# Patient Record
Sex: Female | Born: 1985 | Hispanic: No | Marital: Married | Smoking: Never smoker
Health system: Southern US, Community
[De-identification: ages and names within clinical notes are randomized; demographics above are authoritative.]

## PROBLEM LIST (undated history)

## (undated) ENCOUNTER — Inpatient Hospital Stay (HOSPITAL_COMMUNITY): Payer: Self-pay

## (undated) DIAGNOSIS — E611 Iron deficiency: Secondary | ICD-10-CM

## (undated) DIAGNOSIS — I10 Essential (primary) hypertension: Secondary | ICD-10-CM

## (undated) DIAGNOSIS — K219 Gastro-esophageal reflux disease without esophagitis: Secondary | ICD-10-CM

## (undated) DIAGNOSIS — Z789 Other specified health status: Secondary | ICD-10-CM

## (undated) HISTORY — DX: Other specified health status: Z78.9

## (undated) HISTORY — DX: Gastro-esophageal reflux disease without esophagitis: K21.9

## (undated) HISTORY — PX: NO PAST SURGERIES: SHX2092

---

## 2016-01-01 NOTE — L&D Delivery Note (Signed)
30 y/o G1P0 who presented with spontaneous onset of labor. No complications of pregnancy  Delivery Note At  a viable female was delivered via  (Presentation:direct OA ;  ).  APGAR:9 , 9; Placenta status: delivered intact with gentle traction Cord: 3 vessel  Anesthesia:  Local lidocaine for repair Episiotomy:   none Lacerations:   partial 3rd degree not involving the rectal mucosa Suture Repair: 3.0 vircyl Est. Blood Loss (mL):   100  Mom to postpartum.  Baby to Couplet care / Skin to Skin.  Ernestina Pennaicholas Byanka Landrus 08/31/2016, 11:42 AM

## 2016-03-19 LAB — OB RESULTS CONSOLE ABO/RH: RH Type: POSITIVE

## 2016-03-19 LAB — OB RESULTS CONSOLE GC/CHLAMYDIA
CHLAMYDIA, DNA PROBE: NEGATIVE
Gonorrhea: NEGATIVE

## 2016-03-19 LAB — OB RESULTS CONSOLE HIV ANTIBODY (ROUTINE TESTING): HIV: NONREACTIVE

## 2016-03-19 LAB — OB RESULTS CONSOLE ANTIBODY SCREEN: ANTIBODY SCREEN: NEGATIVE

## 2016-03-19 LAB — OB RESULTS CONSOLE HEPATITIS B SURFACE ANTIGEN: HEP B S AG: NEGATIVE

## 2016-03-19 LAB — OB RESULTS CONSOLE RPR: RPR: NONREACTIVE

## 2016-03-19 LAB — OB RESULTS CONSOLE RUBELLA ANTIBODY, IGM: RUBELLA: IMMUNE

## 2016-04-23 ENCOUNTER — Inpatient Hospital Stay (HOSPITAL_COMMUNITY)
Admission: AD | Admit: 2016-04-23 | Discharge: 2016-04-23 | Disposition: A | Discharge status: Home / Self Care | Payer: Medicaid Other | Source: Ambulatory Visit | Attending: Family Medicine | Admitting: Family Medicine

## 2016-04-23 ENCOUNTER — Encounter (HOSPITAL_COMMUNITY): Payer: Self-pay | Admitting: *Deleted

## 2016-04-23 DIAGNOSIS — O26899 Other specified pregnancy related conditions, unspecified trimester: Secondary | ICD-10-CM

## 2016-04-23 DIAGNOSIS — Z3A20 20 weeks gestation of pregnancy: Secondary | ICD-10-CM | POA: Diagnosis not present

## 2016-04-23 DIAGNOSIS — R109 Unspecified abdominal pain: Secondary | ICD-10-CM | POA: Diagnosis not present

## 2016-04-23 DIAGNOSIS — O9989 Other specified diseases and conditions complicating pregnancy, childbirth and the puerperium: Secondary | ICD-10-CM

## 2016-04-23 DIAGNOSIS — O26892 Other specified pregnancy related conditions, second trimester: Secondary | ICD-10-CM | POA: Diagnosis present

## 2016-04-23 LAB — URINALYSIS, ROUTINE W REFLEX MICROSCOPIC
Bilirubin Urine: NEGATIVE
Glucose, UA: NEGATIVE mg/dL
Hgb urine dipstick: NEGATIVE
Ketones, ur: NEGATIVE mg/dL
Leukocytes, UA: NEGATIVE
NITRITE: NEGATIVE
Protein, ur: NEGATIVE mg/dL
SPECIFIC GRAVITY, URINE: 1.01 (ref 1.005–1.030)
pH: 6 (ref 5.0–8.0)

## 2016-04-23 NOTE — MAU Provider Note (Signed)
History   G1 @ 20 wks in with abd cramping that started today. Denies vag bleeding or ROM.  CSN: [id number]  Arrival date & time 04/23/16  1629   None     Chief Complaint  Patient presents with  . Abdominal Pain    HPI  No past medical history on file.  No past surgical history on file.  No family history on file.  Social History  Substance Use Topics  . Smoking status: None  . Smokeless tobacco: None  . Alcohol Use: None    OB History    Gravida Para Term Preterm AB TAB SAB Ectopic Multiple Living   1 0 0 0 0 0 0 0 0 0       Review of Systems  Constitutional: Negative.   HENT: Negative.   Eyes: Negative.   Respiratory: Negative.   Cardiovascular: Negative.   Gastrointestinal: Positive for abdominal pain.  Endocrine: Negative.   Genitourinary: Negative.   Musculoskeletal: Negative.   Skin: Negative.   Allergic/Immunologic: Negative.   Neurological: Negative.   Hematological: Negative.   Psychiatric/Behavioral: Negative.     Allergies  Review of patient's allergies indicates not on file.  Home Medications  No current outpatient prescriptions on file.  BP 115/65 mmHg  Pulse 102  Temp(Src) 98.2 F (36.8 C) (Oral)  Resp 18  Ht 5' 8.5" (1.74 m)  Wt 123 lb 12.8 oz (56.155 kg)  BMI 18.55 kg/m2  SpO2 100%  LMP 12/03/2015  Physical Exam  Constitutional: She is oriented to person, place, and time. She appears well-developed and well-nourished.  HENT:  Head: Normocephalic.  Eyes: Pupils are equal, round, and reactive to light.  Neck: Normal range of motion.  Cardiovascular: Normal rate, regular rhythm, normal heart sounds and intact distal pulses.   Pulmonary/Chest: Effort normal and breath sounds normal.  Abdominal: Soft. Bowel sounds are normal.  Genitourinary: Vagina normal and uterus normal.  Musculoskeletal: Normal range of motion.  Neurological: She is alert and oriented to person, place, and time. She has normal reflexes.  Skin: Skin is warm  and dry.  Psychiatric: She has a normal mood and affect. Her behavior is normal. Judgment and thought content normal.    MAU Course  Procedures (including critical care time)  Labs Reviewed  URINALYSIS, ROUTINE W REFLEX MICROSCOPIC (NOT AT [facility])   No results found.   No diagnosis found.    MDM  SVE firm/cl/post/high. Will d/c home . FHR [address] and reg.

## 2016-04-23 NOTE — MAU Note (Signed)
Patient c/o abdominal pain for past few days.  [redacted] weeks pregnant, denies vaginal bleeding or LOF.

## 2016-07-11 ENCOUNTER — Inpatient Hospital Stay (HOSPITAL_COMMUNITY)
Admission: AD | Admit: 2016-07-11 | Discharge: 2016-07-11 | Disposition: A | Discharge status: Home / Self Care | Payer: Medicaid Other | Source: Ambulatory Visit | Attending: Obstetrics & Gynecology | Admitting: Obstetrics & Gynecology

## 2016-07-11 DIAGNOSIS — R519 Headache, unspecified: Secondary | ICD-10-CM

## 2016-07-11 DIAGNOSIS — O26893 Other specified pregnancy related conditions, third trimester: Secondary | ICD-10-CM | POA: Insufficient documentation

## 2016-07-11 DIAGNOSIS — R5383 Other fatigue: Secondary | ICD-10-CM

## 2016-07-11 DIAGNOSIS — R5381 Other malaise: Secondary | ICD-10-CM

## 2016-07-11 DIAGNOSIS — R51 Headache: Secondary | ICD-10-CM | POA: Diagnosis present

## 2016-07-11 DIAGNOSIS — Z3A31 31 weeks gestation of pregnancy: Secondary | ICD-10-CM | POA: Diagnosis not present

## 2016-07-11 LAB — URINALYSIS, ROUTINE W REFLEX MICROSCOPIC
Bilirubin Urine: NEGATIVE
GLUCOSE, UA: NEGATIVE mg/dL
HGB URINE DIPSTICK: NEGATIVE
Ketones, ur: NEGATIVE mg/dL
LEUKOCYTES UA: NEGATIVE
Nitrite: NEGATIVE
PROTEIN: NEGATIVE mg/dL
pH: 6 (ref 5.0–8.0)

## 2016-07-11 LAB — CBC
HEMATOCRIT: 36.5 % (ref 36.0–46.0)
HEMOGLOBIN: 12 g/dL (ref 12.0–15.0)
MCH: 25.4 pg — ABNORMAL LOW (ref 26.0–34.0)
MCHC: 32.9 g/dL (ref 30.0–36.0)
MCV: 77.3 fL — AB (ref 78.0–100.0)
Platelets: 240 10*3/uL (ref 150–400)
RBC: 4.72 MIL/uL (ref 3.87–5.11)
RDW: 15.3 % (ref 11.5–15.5)
WBC: 12.4 10*3/uL — AB (ref 4.0–10.5)

## 2016-07-11 NOTE — MAU Provider Note (Signed)
MAU HISTORY AND PHYSICAL  Chief Complaint:  Headache   Allison Holmes is a 30 y.o.  G2P0000 with IUP at 3950w4d presenting for Headache . Patient states she has been having  none contractions, none vaginal bleeding, intact membranes, with active fetal movement.    No past medical history on file.  No past surgical history on file.  No family history on file.  Social History  Substance Use Topics  . Smoking status: Not on file  . Smokeless tobacco: Not on file  . Alcohol Use: Not on file    No Known Allergies  Prescriptions prior to admission  Medication Sig Dispense Refill Last Dose  . acetaminophen (TYLENOL) 500 MG tablet Take 500 mg by mouth every 6 (six) hours as needed for moderate pain or headache.   07/11/2016 at Unknown time  . Prenatal Vit-Fe Fumarate-FA (PRENATAL MULTIVITAMIN) TABS tablet Take 1 tablet by mouth daily at 12 noon.   07/10/2016 at Unknown time    Review of Systems - Negative except for what is mentioned in HPI.  Physical Exam  Blood pressure 99/59, pulse 80, temperature 98.3 F (36.8 C), temperature source Oral, resp. rate 18, last menstrual period 12/03/2015. GENERAL: Well-developed, well-nourished female in no acute distress.  LUNGS: Clear to auscultation bilaterally.  HEART: Regular rate and rhythm. ABDOMEN: Soft, nontender, nondistended, gravid.  EXTREMITIES: Nontender, no edema, 2+ distal pulses. FHT:  Cat 1 Contractions: very infrequent   Labs: Results for orders placed or performed during the hospital encounter of 07/11/16 (from the past 24 hour(s))  Urinalysis, Routine w reflex microscopic (not at [facility])   Collection Time: 07/11/16  4:45 PM  Result Value Ref Range   Color, Urine YELLOW YELLOW   APPearance CLEAR CLEAR   Specific Gravity, Urine <1.005 (L) 1.005 - 1.030   pH 6.0 5.0 - 8.0   Glucose, UA NEGATIVE NEGATIVE mg/dL   Hgb urine dipstick NEGATIVE NEGATIVE   Bilirubin Urine NEGATIVE NEGATIVE   Ketones, ur NEGATIVE NEGATIVE mg/dL   Protein, ur NEGATIVE NEGATIVE mg/dL   Nitrite NEGATIVE NEGATIVE   Leukocytes, UA NEGATIVE NEGATIVE    Imaging Studies:  No results found.  Assessment: Allison Holmes is  30 y.o. G2P0000 at 4750w4d presents with Headache .  Plan: Headache, mild shortness of breath: Lung exam clear, HA is relieved by tylenol. BP WNL. Pt appears undernourished, will get CBC to rule out anemia.  -if CBC within normal limits, will dispo home to f/u with [facility]  Loni MuseKate Timberlake 7/12/20178:11 PM  I have participated in the care of this patient and I agree with the above. Cam HaiSHAW, Odelia Graciano CNM 10:31 PM 07/13/2016

## 2016-07-11 NOTE — MAU Note (Signed)
Pt received to MAU c/o feeling feverish with an overall complaint of fatigue. Interpreter contacted via [employer]. EFM applied. [id number]. Abdomen soft on palpation. Carmelina DaneERRI L Sebert Stollings, RN

## 2016-07-11 NOTE — MAU Note (Addendum)
Pt C/O fever last night, did not take temp, HA, unable to sleep very much.  Denies abd pain, bleeding or LOF.  Had vomiting yesterday, none today.

## 2016-08-13 LAB — OB RESULTS CONSOLE GBS: GBS: POSITIVE

## 2016-08-30 ENCOUNTER — Inpatient Hospital Stay (HOSPITAL_COMMUNITY)
Admission: AD | Admit: 2016-08-30 | Discharge: 2016-08-30 | Disposition: A | Discharge status: Home / Self Care | Payer: Medicaid Other | Source: Ambulatory Visit | Attending: Obstetrics and Gynecology | Admitting: Obstetrics and Gynecology

## 2016-08-30 ENCOUNTER — Encounter (HOSPITAL_COMMUNITY): Payer: Self-pay | Admitting: *Deleted

## 2016-08-30 NOTE — MAU Note (Signed)
Pt started having contraction last night about 2300 and having progressed to every 11 minutes now.  Pt report some bloody show, denies ROM.  Good fetal movement.

## 2016-08-30 NOTE — Discharge Instructions (Signed)
Fetal Movement Counts  Patient Name: __________________________________________________ Patient Due Date: ____________________  Performing a fetal movement count is highly recommended in high-risk pregnancies, but it is good for every pregnant woman to do. Your health care provider may ask you to start counting fetal movements at 28 weeks of the pregnancy. Fetal movements often increase:  · After eating a full meal.  · After physical activity.  · After eating or drinking something sweet or cold.  · At rest.  Pay attention to when you feel the baby is most active. This will help you notice a pattern of your baby's sleep and wake cycles and what factors contribute to an increase in fetal movement. It is important to perform a fetal movement count at the same time each day when your baby is normally most active.   HOW TO COUNT FETAL MOVEMENTS  1. Find a quiet and comfortable area to sit or lie down on your left side. Lying on your left side provides the best blood and oxygen circulation to your baby.  2. Write down the day and time on a sheet of paper or in a journal.  3. Start counting kicks, flutters, swishes, rolls, or jabs in a 2-hour period. You should feel at least 10 movements within 2 hours.  4. If you do not feel 10 movements in 2 hours, wait 2-3 hours and count again. Look for a change in the pattern or not enough counts in 2 hours.  SEEK MEDICAL CARE IF:  · You feel less than 10 counts in 2 hours, tried twice.  · There is no movement in over an hour.  · The pattern is changing or taking longer each day to reach 10 counts in 2 hours.  · You feel the baby is not moving as he or she usually does.  Date: ____________ Movements: ____________ Start time: ____________ Finish time: ____________   Date: ____________ Movements: ____________ Start time: ____________ Finish time: ____________  Date: ____________ Movements: ____________ Start time: ____________ Finish time: ____________  Date: ____________ Movements:  ____________ Start time: ____________ Finish time: ____________  Date: ____________ Movements: ____________ Start time: ____________ Finish time: ____________  Date: ____________ Movements: ____________ Start time: ____________ Finish time: ____________  Date: ____________ Movements: ____________ Start time: ____________ Finish time: ____________  Date: ____________ Movements: ____________ Start time: ____________ Finish time: ____________   Date: ____________ Movements: ____________ Start time: ____________ Finish time: ____________  Date: ____________ Movements: ____________ Start time: ____________ Finish time: ____________  Date: ____________ Movements: ____________ Start time: ____________ Finish time: ____________  Date: ____________ Movements: ____________ Start time: ____________ Finish time: ____________  Date: ____________ Movements: ____________ Start time: ____________ Finish time: ____________  Date: ____________ Movements: ____________ Start time: ____________ Finish time: ____________  Date: ____________ Movements: ____________ Start time: ____________ Finish time: ____________   Date: ____________ Movements: ____________ Start time: ____________ Finish time: ____________  Date: ____________ Movements: ____________ Start time: ____________ Finish time: ____________  Date: ____________ Movements: ____________ Start time: ____________ Finish time: ____________  Date: ____________ Movements: ____________ Start time: ____________ Finish time: ____________  Date: ____________ Movements: ____________ Start time: ____________ Finish time: ____________  Date: ____________ Movements: ____________ Start time: ____________ Finish time: ____________  Date: ____________ Movements: ____________ Start time: ____________ Finish time: ____________   Date: ____________ Movements: ____________ Start time: ____________ Finish time: ____________  Date: ____________ Movements: ____________ Start time: ____________ Finish  time: ____________  Date: ____________ Movements: ____________ Start time: ____________ Finish time: ____________  Date: ____________ Movements: ____________ Start time:   ____________ Finish time: ____________  Date: ____________ Movements: ____________ Start time: ____________ Finish time: ____________  Date: ____________ Movements: ____________ Start time: ____________ Finish time: ____________  Date: ____________ Movements: ____________ Start time: ____________ Finish time: ____________   Date: ____________ Movements: ____________ Start time: ____________ Finish time: ____________  Date: ____________ Movements: ____________ Start time: ____________ Finish time: ____________  Date: ____________ Movements: ____________ Start time: ____________ Finish time: ____________  Date: ____________ Movements: ____________ Start time: ____________ Finish time: ____________  Date: ____________ Movements: ____________ Start time: ____________ Finish time: ____________  Date: ____________ Movements: ____________ Start time: ____________ Finish time: ____________  Date: ____________ Movements: ____________ Start time: ____________ Finish time: ____________   Date: ____________ Movements: ____________ Start time: ____________ Finish time: ____________  Date: ____________ Movements: ____________ Start time: ____________ Finish time: ____________  Date: ____________ Movements: ____________ Start time: ____________ Finish time: ____________  Date: ____________ Movements: ____________ Start time: ____________ Finish time: ____________  Date: ____________ Movements: ____________ Start time: ____________ Finish time: ____________  Date: ____________ Movements: ____________ Start time: ____________ Finish time: ____________  Date: ____________ Movements: ____________ Start time: ____________ Finish time: ____________   Date: ____________ Movements: ____________ Start time: ____________ Finish time: ____________  Date: ____________  Movements: ____________ Start time: ____________ Finish time: ____________  Date: ____________ Movements: ____________ Start time: ____________ Finish time: ____________  Date: ____________ Movements: ____________ Start time: ____________ Finish time: ____________  Date: ____________ Movements: ____________ Start time: ____________ Finish time: ____________  Date: ____________ Movements: ____________ Start time: ____________ Finish time: ____________  Date: ____________ Movements: ____________ Start time: ____________ Finish time: ____________   Date: ____________ Movements: ____________ Start time: ____________ Finish time: ____________  Date: ____________ Movements: ____________ Start time: ____________ Finish time: ____________  Date: ____________ Movements: ____________ Start time: ____________ Finish time: ____________  Date: ____________ Movements: ____________ Start time: ____________ Finish time: ____________  Date: ____________ Movements: ____________ Start time: ____________ Finish time: ____________  Date: ____________ Movements: ____________ Start time: ____________ Finish time: ____________     This information is not intended to replace advice given to you by your health care provider. Make sure you discuss any questions you have with your health care provider.     Document Released: 01/16/2007 Document Revised: 01/07/2015 Document Reviewed: 10/13/2012  Elsevier Interactive Patient Education ©2016 [employer]

## 2016-08-31 ENCOUNTER — Encounter (HOSPITAL_COMMUNITY): Payer: Self-pay | Admitting: *Deleted

## 2016-08-31 ENCOUNTER — Inpatient Hospital Stay (HOSPITAL_COMMUNITY)
Admission: AD | Admit: 2016-08-31 | Discharge: 2016-09-02 | DRG: 775 | Disposition: A | Discharge status: Home / Self Care | Payer: Medicaid Other | Source: Ambulatory Visit | Attending: Obstetrics and Gynecology | Admitting: Obstetrics and Gynecology

## 2016-08-31 DIAGNOSIS — O99824 Streptococcus B carrier state complicating childbirth: Secondary | ICD-10-CM | POA: Diagnosis present

## 2016-08-31 DIAGNOSIS — Z3A38 38 weeks gestation of pregnancy: Secondary | ICD-10-CM | POA: Diagnosis not present

## 2016-08-31 DIAGNOSIS — Z3493 Encounter for supervision of normal pregnancy, unspecified, third trimester: Secondary | ICD-10-CM

## 2016-08-31 DIAGNOSIS — Z3403 Encounter for supervision of normal first pregnancy, third trimester: Secondary | ICD-10-CM | POA: Diagnosis present

## 2016-08-31 LAB — CBC
HCT: 36.7 % (ref 36.0–46.0)
Hemoglobin: 12.4 g/dL (ref 12.0–15.0)
MCH: 25.3 pg — ABNORMAL LOW (ref 26.0–34.0)
MCHC: 33.8 g/dL (ref 30.0–36.0)
MCV: 74.9 fL — AB (ref 78.0–100.0)
PLATELETS: 216 10*3/uL (ref 150–400)
RBC: 4.9 MIL/uL (ref 3.87–5.11)
RDW: 16.1 % — AB (ref 11.5–15.5)
WBC: 11.8 10*3/uL — ABNORMAL HIGH (ref 4.0–10.5)

## 2016-08-31 LAB — ABO/RH: ABO/RH(D): B POS

## 2016-08-31 LAB — TYPE AND SCREEN
ABO/RH(D): B POS
Antibody Screen: NEGATIVE

## 2016-08-31 MED ORDER — DIPHENHYDRAMINE HCL 25 MG PO CAPS: 25.0000 mg | ORAL_CAPSULE | Freq: Four times a day (QID) | ORAL | Status: DC | PRN

## 2016-08-31 MED ORDER — PENICILLIN G POTASSIUM 5000000 UNITS IJ SOLR
2.5000 10*6.[IU] | INTRAVENOUS | Status: DC
  Administered 2016-08-31: 2.5 10*6.[IU] via INTRAVENOUS
  Filled 2016-08-31 (×5): qty 2.5

## 2016-08-31 MED ORDER — PENICILLIN G POTASSIUM 5000000 UNITS IJ SOLR
5.0000 10*6.[IU] | Freq: Once | INTRAVENOUS | Status: AC
  Administered 2016-08-31: 5 10*6.[IU] via INTRAVENOUS
  Filled 2016-08-31: qty 5

## 2016-08-31 MED ORDER — EPHEDRINE 5 MG/ML INJ
10.0000 mg | INTRAVENOUS | Status: DC | PRN
Start: 2016-08-31 — End: 2016-08-31
  Filled 2016-08-31: qty 4

## 2016-08-31 MED ORDER — ACETAMINOPHEN 325 MG PO TABS: 650.0000 mg | ORAL_TABLET | ORAL | Status: DC | PRN

## 2016-08-31 MED ORDER — SIMETHICONE 80 MG PO CHEW: 80.0000 mg | CHEWABLE_TABLET | ORAL | Status: DC | PRN

## 2016-08-31 MED ORDER — BENZOCAINE-MENTHOL 20-0.5 % EX AERO
1.0000 "application " | INHALATION_SPRAY | Freq: Four times a day (QID) | CUTANEOUS | Status: DC | PRN
  Filled 2016-08-31 (×3): qty 56

## 2016-08-31 MED ORDER — ONDANSETRON HCL 4 MG/2ML IJ SOLN: 4.0000 mg | INTRAMUSCULAR | Status: DC | PRN

## 2016-08-31 MED ORDER — ONDANSETRON HCL 4 MG PO TABS: 4.0000 mg | ORAL_TABLET | ORAL | Status: DC | PRN

## 2016-08-31 MED ORDER — SENNOSIDES-DOCUSATE SODIUM 8.6-50 MG PO TABS
2.0000 | ORAL_TABLET | ORAL | Status: DC
  Administered 2016-09-01 – 2016-09-02 (×2): 2 via ORAL
  Filled 2016-08-31 (×2): qty 2

## 2016-08-31 MED ORDER — FENTANYL CITRATE (PF) 100 MCG/2ML IJ SOLN
INTRAMUSCULAR | Status: AC
  Administered 2016-08-31: 100 ug via INTRAVENOUS
  Filled 2016-08-31: qty 2

## 2016-08-31 MED ORDER — DIPHENHYDRAMINE HCL 50 MG/ML IJ SOLN: 12.5000 mg | INTRAMUSCULAR | Status: DC | PRN

## 2016-08-31 MED ORDER — ZOLPIDEM TARTRATE 5 MG PO TABS: 5.0000 mg | ORAL_TABLET | Freq: Every evening | ORAL | Status: DC | PRN

## 2016-08-31 MED ORDER — IBUPROFEN 600 MG PO TABS
600.0000 mg | ORAL_TABLET | Freq: Four times a day (QID) | ORAL | Status: DC
  Administered 2016-08-31 – 2016-09-02 (×8): 600 mg via ORAL
  Filled 2016-08-31 (×8): qty 1

## 2016-08-31 MED ORDER — PHENYLEPHRINE 40 MCG/ML (10ML) SYRINGE FOR IV PUSH (FOR BLOOD PRESSURE SUPPORT)
80.0000 ug | PREFILLED_SYRINGE | INTRAVENOUS | Status: DC | PRN
  Filled 2016-08-31: qty 5

## 2016-08-31 MED ORDER — EPHEDRINE 5 MG/ML INJ
10.0000 mg | INTRAVENOUS | Status: DC | PRN
  Filled 2016-08-31: qty 4

## 2016-08-31 MED ORDER — LACTATED RINGERS IV SOLN: 500.0000 mL | Freq: Once | INTRAVENOUS | Status: DC

## 2016-08-31 MED ORDER — LIDOCAINE HCL (PF) 1 % IJ SOLN
30.0000 mL | INTRAMUSCULAR | Status: AC | PRN
  Administered 2016-08-31: 30 mL via SUBCUTANEOUS
  Filled 2016-08-31: qty 30

## 2016-08-31 MED ORDER — SOD CITRATE-CITRIC ACID 500-334 MG/5ML PO SOLN: 30.0000 mL | ORAL | Status: DC | PRN

## 2016-08-31 MED ORDER — OXYCODONE-ACETAMINOPHEN 5-325 MG PO TABS: 1.0000 | ORAL_TABLET | ORAL | Status: DC | PRN

## 2016-08-31 MED ORDER — OXYCODONE-ACETAMINOPHEN 5-325 MG PO TABS: 2.0000 | ORAL_TABLET | ORAL | Status: DC | PRN

## 2016-08-31 MED ORDER — BENZOCAINE-MENTHOL 20-0.5 % EX AERO
1.0000 "application " | INHALATION_SPRAY | CUTANEOUS | Status: DC | PRN
  Administered 2016-08-31: 1 via TOPICAL

## 2016-08-31 MED ORDER — PRENATAL MULTIVITAMIN CH
1.0000 | ORAL_TABLET | Freq: Every day | ORAL | Status: DC
  Administered 2016-09-01: 1 via ORAL
  Filled 2016-08-31: qty 1

## 2016-08-31 MED ORDER — WITCH HAZEL-GLYCERIN EX PADS: 1.0000 "application " | MEDICATED_PAD | CUTANEOUS | Status: DC | PRN

## 2016-08-31 MED ORDER — ACETAMINOPHEN 325 MG PO TABS
650.0000 mg | ORAL_TABLET | ORAL | Status: DC | PRN
  Administered 2016-09-01: 650 mg via ORAL
  Filled 2016-08-31: qty 2

## 2016-08-31 MED ORDER — LACTATED RINGERS IV SOLN
INTRAVENOUS | Status: DC
  Administered 2016-08-31: 125 mL/h via INTRAVENOUS

## 2016-08-31 MED ORDER — DIBUCAINE 1 % RE OINT: 1.0000 "application " | TOPICAL_OINTMENT | RECTAL | Status: DC | PRN

## 2016-08-31 MED ORDER — OXYTOCIN BOLUS FROM INFUSION
500.0000 mL | Freq: Once | INTRAVENOUS | Status: AC
  Administered 2016-08-31: 500 mL via INTRAVENOUS

## 2016-08-31 MED ORDER — FENTANYL CITRATE (PF) 100 MCG/2ML IJ SOLN
100.0000 ug | INTRAMUSCULAR | Status: DC | PRN
  Administered 2016-08-31: 100 ug via INTRAVENOUS

## 2016-08-31 MED ORDER — FENTANYL 2.5 MCG/ML BUPIVACAINE 1/10 % EPIDURAL INFUSION (WH - ANES)
14.0000 mL/h | INTRAMUSCULAR | Status: DC | PRN
  Filled 2016-08-31: qty 125

## 2016-08-31 MED ORDER — OXYCODONE HCL 5 MG PO TABS
5.0000 mg | ORAL_TABLET | ORAL | Status: DC | PRN
  Administered 2016-08-31 – 2016-09-01 (×3): 5 mg via ORAL
  Filled 2016-08-31 (×3): qty 1

## 2016-08-31 MED ORDER — TETANUS-DIPHTH-ACELL PERTUSSIS 5-2.5-18.5 LF-MCG/0.5 IM SUSP: 0.5000 mL | Freq: Once | INTRAMUSCULAR | Status: DC

## 2016-08-31 MED ORDER — LACTATED RINGERS IV SOLN: 500.0000 mL | INTRAVENOUS | Status: DC | PRN

## 2016-08-31 MED ORDER — COCONUT OIL OIL: 1.0000 "application " | TOPICAL_OIL | Status: DC | PRN

## 2016-08-31 MED ORDER — ONDANSETRON HCL 4 MG/2ML IJ SOLN: 4.0000 mg | Freq: Four times a day (QID) | INTRAMUSCULAR | Status: DC | PRN

## 2016-08-31 MED ORDER — FENTANYL CITRATE (PF) 100 MCG/2ML IJ SOLN
50.0000 ug | INTRAMUSCULAR | Status: DC | PRN
  Administered 2016-08-31: 50 ug via INTRAVENOUS
  Filled 2016-08-31: qty 2

## 2016-08-31 MED ORDER — OXYTOCIN 40 UNITS IN LACTATED RINGERS INFUSION - SIMPLE MED
2.5000 [IU]/h | INTRAVENOUS | Status: DC
  Filled 2016-08-31: qty 1000

## 2016-08-31 MED ORDER — FLEET ENEMA 7-19 GM/118ML RE ENEM: 1.0000 | ENEMA | RECTAL | Status: DC | PRN

## 2016-08-31 NOTE — H&P (Signed)
LABOR AND DELIVERY ADMISSION HISTORY AND PHYSICAL NOTE  Allison Holmes is a 30 y.o. female G1P0000 with IUP at 3662w6d by LMP confirmed by 1564w3d US presenting for SOL.   She reports positive fetal movement. She denies leakage of fluid or vaginal bleeding. She reports contractions over the past day, increasing in frequency and intensity, currently every 2-3 minutes.  Prenatal History/Complications: Varicella non-immune  Past Medical History: History reviewed. No pertinent past medical history.  Past Surgical History: History reviewed. No pertinent surgical history.  Obstetrical History: OB History    Gravida Para Term Preterm AB Living   1 0 0 0 0 0   SAB TAB Ectopic Multiple Live Births   0 0 0 0        Social History: Social History   Social History  . Marital status: Married    Spouse name: N/A  . Number of children: N/A  . Years of education: N/A   Social History Main Topics  . Smoking status: Never Smoker  . Smokeless tobacco: Never Used  . Alcohol use None  . Drug use: Unknown  . Sexual activity: Yes   Other Topics Concern  . None   Social History Narrative  . None    Family History: History reviewed. No pertinent family history.  Allergies: No Known Allergies  Prescriptions Prior to Admission  Medication Sig Dispense Refill Last Dose  . acetaminophen (TYLENOL) 500 MG tablet Take 500 mg by mouth every 6 (six) hours as needed for moderate pain or headache.   07/11/2016 at Unknown time  . Prenatal Vit-Fe Fumarate-FA (PRENATAL MULTIVITAMIN) TABS tablet Take 1 tablet by mouth daily at 12 noon.   07/10/2016 at Unknown time     Review of Systems   All systems reviewed and negative except as stated in HPI  Blood pressure 136/76, pulse 73, temperature 98.5 F (36.9 C), temperature source Oral, resp. rate 20, last menstrual period 12/03/2015. General appearance: alert, cooperative and no distress Lungs: no respiratory distress Heart: regular rate Abdomen:  soft, non-tender Extremities: No calf swelling or tenderness Presentation: cephalic by cervical exam Fetal monitoring: 140 baseline, moderate variability, + accels, no decels Uterine activity: ctx q2-3 min Dilation: 8 Effacement (%): 100 Station: 0 Exam by:: Camelia Enganielle Simpson RN   Prenatal labs: ABO, Rh: --/--/B POS (09/01 0340) Antibody: NEG (09/01 0340) Rubella: Immune RPR: Nonreactive (03/20 0000)  HBsAg: Negative (03/20 0000)  HIV: Non-reactive (03/20 0000)  GBS: Positive (08/14 0000)  Genetic screening: Normal Anatomy US: Wnl (low-lying placenta, no placenta previa on subsequent US)  Prenatal Transfer Tool  Maternal Diabetes: No Genetic Screening: Normal Maternal Ultrasounds/Referrals: Normal Fetal Ultrasounds or other Referrals:  None Maternal Substance Abuse:  No Significant Maternal Medications:  None Significant Maternal Lab Results: Lab values include: Group B Strep positive, Varicella non-immune  Results for orders placed or performed during the hospital encounter of 08/31/16 (from the past 24 hour(s))  CBC   Collection Time: 08/31/16  3:40 AM  Result Value Ref Range   WBC 11.8 (H) 4.0 - 10.5 K/uL   RBC 4.90 3.87 - 5.11 MIL/uL   Hemoglobin 12.4 12.0 - 15.0 g/dL   HCT 16.136.7 09.636.0 - 04.546.0 %   MCV 74.9 (L) 78.0 - 100.0 fL   MCH 25.3 (L) 26.0 - 34.0 pg   MCHC 33.8 30.0 - 36.0 g/dL   RDW 40.916.1 (H) 81.111.5 - 91.415.5 %   Platelets 216 150 - 400 K/uL  Type and screen [facility]  Collection Time: 08/31/16  3:40 AM  Result Value Ref Range   ABO/RH(D) B POS    Antibody Screen NEG    Sample Expiration 09/03/2016     Patient Active Problem List   Diagnosis Date Noted  . Normal pregnancy in third trimester 08/31/2016    Assessment: Allison Holmes is a 30 y.o. G1P0000 at [redacted]w[redacted]d here for SOL.  #labor: Expectant management, SVD #Pain: IV pain meds currently, considering epidural #FWB: Category I #ID: GBS positive #MOF: Breast and bottle #MOC:  OCPs #Circ: No  Ambrose Finland, Medical Student  9/1/20175:36 AM

## 2016-08-31 NOTE — MAU Note (Signed)
Patient returns to mau after being discharged late yesterday. Reports frequent contractions. Denies LOF or VB at this time. +FM

## 2016-08-31 NOTE — Progress Notes (Signed)
Admission education done via Live  Interpreter. Plan of care explained. All questions answered.

## 2016-09-01 LAB — RPR: RPR: NONREACTIVE

## 2016-09-01 NOTE — Progress Notes (Signed)
Post Partum Day 1 Subjective: no complaints, up ad lib, voiding, tolerating PO and + flatus  Objective: Blood pressure 96/61, pulse 63, temperature 97.8 F (36.6 C), temperature source Oral, resp. rate 16, height 5' 7.72" (1.72 m), weight 65.8 kg (145 lb), last menstrual period 12/03/2015, SpO2 100 %, unknown if currently breastfeeding.  Physical Exam:  General: alert, cooperative and no distress Lochia: appropriate Uterine Fundus: firm Laceration: healing well, no significant erythema, slight serosanguineous drainage present DVT Evaluation: No evidence of DVT seen on physical exam. Negative Homan's sign.   Recent Labs  08/31/16 0340  HGB 12.4  HCT 36.7    Assessment/Plan: Plan for discharge tomorrow   LOS: 1 day   Allison Holmes PGY-1 09/01/2016, 7:59 AM   CNM attestation Post Partum Day #1 I have seen and examined this patient and agree with above documentation in the resident's note.   Allison Holmes is a 30 y.o. G1P1001 s/p SVD.  Pt denies problems with ambulating, voiding or po intake. Pain is well controlled.  Plan for birth control is oral contraceptives (estrogen/progesterone).  Method of Feeding: both  PE:  BP 123/72   Pulse 68   Temp 98.7 F (37.1 C) (Oral)   Resp 16   Ht 5' 7.72" (1.72 m)   Wt 65.8 kg (145 lb)   LMP 12/03/2015   SpO2 99%   Breastfeeding? Unknown   BMI 22.23 kg/m  Fundus firm  Plan for discharge: 09/02/16  Cam HaiSHAW, Aerin Delany, CNM 12:16 PM

## 2016-09-01 NOTE — Plan of Care (Signed)
Problem: Role Relationship: Goal: Ability to demonstrate positive interaction with newborn will improve Outcome: Progressing Awilda Metroonna C Katielynn Horan, RN Registered Nurse Signed   Plan of Care Date of Service: 09/01/2016 7:44 PM      Problem: Nutritional: Goal: Nutritional status of the infant will improve as evidenced by minimal weight loss and appropriate weight gain for gestational age Outcome: Progressing At [id number], I assisted the infant's mother to breast feed skin-to-skin, both in obtaining a deep latch and in positioning the infant in a cross hold position. The mother showed no previous experience in breastfeeding in the position. A latch score of 8 was obtained, occasional swallow auscultated. The infant breast fed for 30 minutes. At 1700, I assisted the mother to breast feed in a football hold, the mother again, showing no previous experience in feeding the infant in this position. The infant successfully breast fed for another 15 minutes and when the mother offered the formula thereafter, the infant only took a few sips.

## 2016-09-01 NOTE — Lactation Note (Signed)
This note was copied from a baby's chart. Lactation Consultation Note  Patient Name: Boy Allison GauzeRowa Arboleda ZOXWR'UToday's Date: 09/01/2016 Reason for consult: Initial assessment Breastfeeding consultation services and support information given to patient.  Mom states baby is latching on easily with cues but she desires to also give formula supplement until milk comes in.  Discussed colostrum and milk coming to volume.  Stressed importance of always putting baby to breast first and giving small amounts of formula.  Encouraged to call out for concerns/assist.  Maternal Data    Feeding    LATCH Score/Interventions                      Lactation Tools Discussed/Used     Consult Status Consult Status: Follow-up Date: 09/02/16 Follow-up type: In-patient    Huston FoleyMOULDEN, Nefertari Rebman S 09/01/2016, 10:43 AM

## 2016-09-02 MED ORDER — IBUPROFEN 600 MG PO TABS: 600.0000 mg | ORAL_TABLET | Freq: Four times a day (QID) | ORAL | 0 refills | Status: DC

## 2016-09-02 MED ORDER — SENNOSIDES-DOCUSATE SODIUM 8.6-50 MG PO TABS: 2.0000 | ORAL_TABLET | ORAL | 0 refills | Status: DC

## 2016-09-02 MED ORDER — OXYCODONE HCL 5 MG PO TABS: 5.0000 mg | ORAL_TABLET | ORAL | 0 refills | Status: DC | PRN

## 2016-09-02 NOTE — Discharge Instructions (Signed)
Vaginal Delivery, Care After °Refer to this sheet in the next few weeks. These discharge instructions provide you with information on caring for yourself after delivery. Your health care provider may also give you specific instructions. Your treatment has been planned according to the most current medical practices available, but problems sometimes occur. Call your health care provider if you have any problems or questions after you go home. °HOME CARE INSTRUCTIONS °· Take over-the-counter or prescription medicines only as directed by your health care provider or pharmacist. °· Do not drink alcohol, especially if you are breastfeeding or taking medicine to relieve pain. °· Do not chew or smoke tobacco. °· Do not use illegal drugs. °· Continue to use good perineal care. Good perineal care includes: °¨ Wiping your perineum from front to back. °¨ Keeping your perineum clean. °· Do not use tampons or douche until your health care provider says it is okay. °· Shower, wash your hair, and take tub baths as directed by your health care provider. °· Wear a well-fitting bra that provides breast support. °· Eat healthy foods. °· Drink enough fluids to keep your urine clear or pale yellow. °· Eat high-fiber foods such as whole grain cereals and breads, brown rice, beans, and fresh fruits and vegetables every day. These foods may help prevent or relieve constipation. °· Follow your health care provider's recommendations regarding resumption of activities such as climbing stairs, driving, lifting, exercising, or traveling. °· Talk to your health care provider about resuming sexual activities. Resumption of sexual activities is dependent upon your risk of infection, your rate of healing, and your comfort and desire to resume sexual activity. °· Try to have someone help you with your household activities and your newborn for at least a few days after you leave the hospital. °· Rest as much as possible. Try to rest or take a nap  when your newborn is sleeping. °· Increase your activities gradually. °· Keep all of your scheduled postpartum appointments. It is very important to keep your scheduled follow-up appointments. At these appointments, your health care provider will be checking to make sure that you are healing physically and emotionally. °SEEK MEDICAL CARE IF:  °· You are passing large clots from your vagina. Save any clots to show your health care provider. °· You have a foul smelling discharge from your vagina. °· You have trouble urinating. °· You are urinating frequently. °· You have pain when you urinate. °· You have a change in your bowel movements. °· You have increasing redness, pain, or swelling near your vaginal incision (episiotomy) or vaginal tear. °· You have pus draining from your episiotomy or vaginal tear. °· Your episiotomy or vaginal tear is separating. °· You have painful, hard, or reddened breasts. °· You have a severe headache. °· You have blurred vision or see spots. °· You feel sad or depressed. °· You have thoughts of hurting yourself or your newborn. °· You have questions about your care, the care of your newborn, or medicines. °· You are dizzy or light-headed. °· You have a rash. °· You have nausea or vomiting. °· You were breastfeeding and have not had a menstrual period within 12 weeks after you stopped breastfeeding. °· You are not breastfeeding and have not had a menstrual period by the 12th week after delivery. °· You have a fever. °SEEK IMMEDIATE MEDICAL CARE IF:  °· You have persistent pain. °· You have chest pain. °· You have shortness of breath. °· You faint. °· You   have leg pain.  You have stomach pain.  Your vaginal bleeding saturates two or more sanitary pads in 1 hour.   This information is not intended to replace advice given to you by your health care provider. Make sure you discuss any questions you have with your health care provider.   Document Released: 12/14/2000 Document Revised:  09/07/2015 Document Reviewed: 08/13/2012 Elsevier Interactive Patient Education [employer]       1             .          .                      .              .            Marland Kitchen                 .                    .         .         3  5         .     .           .       .              .       Marland Kitchen                 Marland Kitchen                5  10 .       .      (   )       .                      .                    .              .           . .                        :                .       .             .           20                      . .                .    .        24  .                      2                       .         .                .            .           .       .         .            Marland Kitchen                             .      :      .      .            .        .          .              .          .            :  4100   .   38                           .                    .                     .      -         .              .         .          .       .            .          .                    4  6  .            . Text adapted from materials produced by [employer] (www.healthinfotranslations.com). This work is licensed under Insurance risk surveyorthe Creative Commons Attribution-NonCommercial-NoDerivs License http://creativecommons.org/licenses/by-[place]-nd/3.0/us A [employer] project www.healthyroadsmedia.org Arabic - Your Recovery After Vaginal Birth (Part 2) Last reviewed 2012.

## 2016-09-02 NOTE — Discharge Summary (Signed)
OB Discharge Summary     Patient Name: Allison Holmes DOB: 1986-01-05 MRN: [id number]  Date of admission: 08/31/2016 Delivering MD: Ernestina Penna MICHAEL   Date of discharge: 09/02/2016  Admitting diagnosis: 39 WEEKS CTX Intrauterine pregnancy: [redacted]w[redacted]d     Secondary diagnosis:  Active Problems:   Normal pregnancy in third trimester  Additional problems: none     Discharge diagnosis: Term Pregnancy Delivered                                                                                                Post partum procedures:none  Augmentation: none  Complications: None  Hospital course:  Onset of Labor With Vaginal Delivery     30 y.o. yo G1P1001 at [redacted]w[redacted]d was admitted in Active Labor on 08/31/2016. Patient had an uncomplicated labor course as follows:  Membrane Rupture Time/Date: 7:04 AM ,08/31/2016   Intrapartum Procedures: Episiotomy: None [1]                                         Lacerations:  3rd degree [4];Perineal [11]  Patient had a delivery of a Viable infant. 08/31/2016  Information for the patient's newborn:  Hildagard, Sobecki [[id number]]  Delivery Method: Vag-Spont    Pateint had an uncomplicated postpartum course.  She is ambulating, tolerating a regular diet, passing flatus, and urinating well. Patient is discharged home in stable condition on 09/02/16.    Physical exam Vitals:   08/31/16 2210 09/01/16 0658 09/01/16 1807 09/02/16 0651  BP: 126/76 96/61 110/67 123/72  Pulse: 64 63 85 68  Resp: 18 16 16 16   Temp: 98 F (36.7 C) 97.8 F (36.6 C) 98.4 F (36.9 C) 98.7 F (37.1 C)  TempSrc: Oral Oral Oral Oral  SpO2: 100%  99%   Weight:      Height:       General: alert, cooperative and no distress Lochia: appropriate Uterine Fundus: firm Laceration: Healing well with no significant drainage, No significant erythema DVT Evaluation: No evidence of DVT seen on physical exam. Negative Homan's sign. Labs: Lab Results  Component Value Date   WBC 11.8 (H)  08/31/2016   HGB 12.4 08/31/2016   HCT 36.7 08/31/2016   MCV 74.9 (L) 08/31/2016   PLT 216 08/31/2016   No flowsheet data found.  Discharge instruction: per After Visit Summary and "Baby and Me Booklet".  After visit meds:    Medication List    TAKE these medications   ibuprofen 600 MG tablet Commonly known as:  ADVIL,MOTRIN Take 1 tablet (600 mg total) by mouth every 6 (six) hours.   oxyCODONE 5 MG immediate release tablet Commonly known as:  Oxy IR/ROXICODONE Take 1 tablet (5 mg total) by mouth every 4 (four) hours as needed (pain scale 4-7).   prenatal multivitamin Tabs tablet Take 1 tablet by mouth daily at 12 noon.   senna-docusate 8.6-50 MG tablet Commonly known as:  Senokot-S Take 2 tablets by mouth daily.       Diet: routine  diet  Activity: Advance as tolerated. Pelvic rest for 6 weeks.   Outpatient follow up:6 weeks Follow up Appt:No future appointments. Follow up Visit: Follow-up Information    [facility]   Why:  postpartum visit Contact information: [address] [place] [address] [phone]           Postpartum contraception: Progesterone only pills  Newborn Data: Live born female  Birth Weight: 7 lb 7.8 oz (3396 g) APGAR: 9, 9  Baby Feeding: Bottle and Breast Disposition:home with mother   09/02/2016 Leland HerElsia J Yoo, DO PGY-1   OB FELLOW DISCHARGE ATTESTATION  I have seen and examined this patient and agree with above documentation in the resident's note.   Jen MowElizabeth Keyvin Rison, DO OB Fellow

## 2016-09-02 NOTE — Lactation Note (Signed)
This note was copied from a baby's chart. Lactation Consultation Note  Patient Name: Allison Holmes NWGNF'AToday's Date: 09/02/2016 Reason for consult: Follow-up assessment  Visited with Mom and FOB, baby 4950 hrs old.  Mom has been choosing to supplement baby with formula as "there is no milk".  Asked Mom and FOB if they had a breast pump at home.  Gave a Harmony manual pump, and demonstrated it to them.  Mom's milk coming in, and transitional milk was flowing easily.  Breasts soft.  Parents were so happy.  Placed baby skin to skin, and Mom latched baby easily in cross cradle hold.  Baby fed with multiple swallows heard.  Parents were so happy, and smiling and giggling.  Explained that formula is not needed.  If she chooses to offer bottles, explained importance of pumping and offering expressed breast milk.  Baby to go to breast 8-12 times in 24 hrs.  Encouraged keeping baby skin to skin as much as possible.  Engorgement prevention and treatment discussed.   To call prn for assistance.  OP lactation services reviewed.    LATCH Score/Interventions Latch: Grasps breast easily, tongue down, lips flanged, rhythmical sucking.  Audible Swallowing: Spontaneous and intermittent Intervention(s): Alternate breast massage;Hand expression;Skin to skin  Type of Nipple: Everted at rest and after stimulation  Comfort (Breast/Nipple): Soft / non-tender  Problem noted: Filling Interventions (Filling): Firm support;Frequent nursing;Hand pump  Hold (Positioning): Assistance needed to correctly position infant at breast and maintain latch. Intervention(s): Breastfeeding basics reviewed;Support Pillows;Position options;Skin to skin  LATCH Score: 9  Lactation Tools Discussed/Used Pump Review: Setup, frequency, and cleaning;Milk Storage Initiated by:: Johny Blameraroline Curry Seefeldt RN IBCLC Date initiated:: 09/02/16   Consult Status Consult Status: Complete Date: 09/02/16 Follow-up type: Call as needed    Judee ClaraSmith,  Dionis Autry E 09/02/2016, 12:02 PM

## 2016-09-27 NOTE — Progress Notes (Signed)
Post discharge chart review completed.  

## 2017-06-15 DIAGNOSIS — W010XXA Fall on same level from slipping, tripping and stumbling without subsequent striking against object, initial encounter: Secondary | ICD-10-CM | POA: Insufficient documentation

## 2017-06-15 DIAGNOSIS — S8992XA Unspecified injury of left lower leg, initial encounter: Secondary | ICD-10-CM | POA: Diagnosis present

## 2017-06-15 DIAGNOSIS — Y929 Unspecified place or not applicable: Secondary | ICD-10-CM | POA: Insufficient documentation

## 2017-06-15 DIAGNOSIS — Y9301 Activity, walking, marching and hiking: Secondary | ICD-10-CM | POA: Insufficient documentation

## 2017-06-15 DIAGNOSIS — S83002A Unspecified subluxation of left patella, initial encounter: Secondary | ICD-10-CM | POA: Diagnosis not present

## 2017-06-15 DIAGNOSIS — Y999 Unspecified external cause status: Secondary | ICD-10-CM | POA: Diagnosis not present

## 2017-06-16 ENCOUNTER — Encounter (HOSPITAL_COMMUNITY): Payer: Self-pay

## 2017-06-16 ENCOUNTER — Emergency Department (HOSPITAL_COMMUNITY)
Admission: EM | Admit: 2017-06-16 | Discharge: 2017-06-16 | Disposition: A | Discharge status: Home / Self Care | Payer: BLUE CROSS/BLUE SHIELD | Attending: Emergency Medicine | Admitting: Emergency Medicine

## 2017-06-16 ENCOUNTER — Emergency Department (HOSPITAL_COMMUNITY): Payer: BLUE CROSS/BLUE SHIELD

## 2017-06-16 DIAGNOSIS — S83002A Unspecified subluxation of left patella, initial encounter: Secondary | ICD-10-CM

## 2017-06-16 MED ORDER — NAPROXEN 250 MG PO TABS
500.0000 mg | ORAL_TABLET | Freq: Once | ORAL | Status: AC
  Administered 2017-06-16: 500 mg via ORAL
  Filled 2017-06-16: qty 2

## 2017-06-16 MED ORDER — NAPROXEN 500 MG PO TABS: 500.0000 mg | ORAL_TABLET | Freq: Two times a day (BID) | ORAL | 0 refills | Status: DC

## 2017-06-16 NOTE — ED Notes (Signed)
Pt in XR at this time, spoke with XR tech, will transport pt to inpatient room when scan complete.

## 2017-06-16 NOTE — ED Triage Notes (Signed)
Pt complaining of L knee pain. Pt states tripped and fell today. Pt with full ROM at triage. Pt denies any head injury/trauma.

## 2017-06-16 NOTE — Discharge Instructions (Signed)
Your symptoms are likely due to a dislocation of your knee cap, also known as your patella. Wear a knee sleeve for stability and ice your knee 3-4 times per day. We also recommend use of Naproxen 500mg  every 12 hours for pain and inflammation. Follow up with an orthopedist to ensure resolution of symptoms. You may require physical therapy to strengthen the muscles in your knee.

## 2017-06-16 NOTE — Progress Notes (Signed)
Orthopedic Tech Progress Note Patient Details:  Allison GauzeRowa Holmes 06/01/1986 [id number]  Ortho Devices Type of Ortho Device: Knee Sleeve Ortho Device/Splint Location: dropped off with RN. Ortho Device/Splint Interventions: Rich BraveOrdered   Allison Holmes 06/16/2017, 1:16 AM

## 2017-06-16 NOTE — ED Notes (Signed)
ED Provider at bedside. 

## 2017-06-16 NOTE — ED Provider Notes (Signed)
[facility]-EMERGENCY DEPT Provider Note   CSN: [id number] Arrival date & time: 06/15/17  2353     History   Chief Complaint Chief Complaint  Patient presents with  . Knee Pain    HPI Allison Holmes is a 31 y.o. female.  31 year old female presents to the emergency department for evaluation of left knee pain. Patient states that she was walking yesterday when her patella subluxed to the left. Her husband reports picking her up and having the patella spontaneously reposition. She had a second episode of subluxation later that evening. Patient complains of some pain with ambulation. No medications taken prior to arrival. No direct fall or trauma. No history of similar symptoms.   The history is provided by the patient. A language interpreter was used Psychologist, prison and probation services interpreters).  Knee Pain      History reviewed. No pertinent past medical history.  Patient Active Problem List   Diagnosis Date Noted  . SVD (spontaneous vaginal delivery) 09/02/2016  . Normal pregnancy in third trimester 08/31/2016    History reviewed. No pertinent surgical history.  OB History    Gravida Para Term Preterm AB Living   1 1 1  0 0 1   SAB TAB Ectopic Multiple Live Births   0 0 0 0 1       Home Medications    Prior to Admission medications   Medication Sig Start Date End Date Taking? Authorizing Provider  ibuprofen (ADVIL,MOTRIN) 600 MG tablet Take 1 tablet (600 mg total) by mouth every 6 (six) hours. 09/02/16   Leland Her, DO  naproxen (NAPROSYN) 500 MG tablet Take 1 tablet (500 mg total) by mouth 2 (two) times daily. 06/16/17   Antony Madura, PA-C  oxyCODONE (OXY IR/ROXICODONE) 5 MG immediate release tablet Take 1 tablet (5 mg total) by mouth every 4 (four) hours as needed (pain scale 4-7). 09/02/16   Leland Her, DO  Prenatal Vit-Fe Fumarate-FA (PRENATAL MULTIVITAMIN) TABS tablet Take 1 tablet by mouth daily at 12 noon.    [provider]  senna-docusate (SENOKOT-S) 8.6-50 MG tablet Take 2  tablets by mouth daily. 09/02/16   Leland Her, DO    Family History History reviewed. No pertinent family history.  Social History Social History  Substance Use Topics  . Smoking status: Never Smoker  . Smokeless tobacco: Never Used  . Alcohol use No     Allergies   Patient has no known allergies.   Review of Systems Review of Systems Ten systems reviewed and are negative for acute change, except as noted in the HPI.    Physical Exam Updated Vital Signs BP 124/88   Pulse 79   Temp 98 F (36.7 C)   Resp 16   LMP 06/16/2017 (Exact Date)   SpO2 100%   Physical Exam  Constitutional: She is oriented to person, place, and time. She appears well-developed and well-nourished. No distress.  Calm and nontoxic appearing  HENT:  Head: Normocephalic and atraumatic.  Eyes: Conjunctivae and EOM are normal. No scleral icterus.  Neck: Normal range of motion.  Cardiovascular: Normal rate, regular rhythm and intact distal pulses.   DP and PT pulse 2+ in the LLE  Pulmonary/Chest: Effort normal. No respiratory distress.  Musculoskeletal: Normal range of motion.       Left knee: She exhibits swelling. She exhibits normal range of motion, no deformity, no erythema, normal alignment, no LCL laxity and no MCL laxity. Tenderness found. Medial joint line tenderness noted.  Swelling and mild effusion  to L knee. Normal ROM and patellar mobility. Mild TTP medial to the left patella. No crepitus or deformity.  Neurological: She is alert and oriented to person, place, and time. She exhibits normal muscle tone. Coordination normal.  Sensation to light touch intact in the LLE. Patient weight bearing and ambulatory without assistance. Gait antalgic.  Skin: Skin is warm and dry. No rash noted. She is not diaphoretic. No erythema. No pallor.  Psychiatric: She has a normal mood and affect. Her behavior is normal.  Nursing note and vitals reviewed.    ED Treatments / Results  Labs (all labs ordered  are listed, but only abnormal results are displayed) Labs Reviewed - No data to display  EKG  EKG Interpretation None       Radiology Dg Knee Complete 4 Views Left  Result Date: 06/16/2017 CLINICAL DATA:  Acute onset of left knee pain.  Initial encounter. EXAM: LEFT KNEE - COMPLETE 4+ VIEW COMPARISON:  None. FINDINGS: There is mild flattening of the lateral tibial plateau, thought to reflect remote injury. No significant knee joint effusion is seen to suggest acute fracture. Visualized joint spaces are grossly preserved. The patella is unremarkable in appearance. No significant soft tissue abnormalities are characterized on radiograph. IMPRESSION: 1. No definite evidence of acute fracture. 2. Mild flattening at the lateral tibial plateau is thought to reflect remote injury. Would correlate with the patient's symptoms. Electronically Signed   By: Roanna RaiderJeffery  Chang M.D.   On: 06/16/2017 00:40    Procedures Procedures (including critical care time)  Medications Ordered in ED Medications  naproxen (NAPROSYN) tablet 500 mg (500 mg Oral Given 06/16/17 0059)     Initial Impression / Assessment and Plan / ED Course  I have reviewed the triage vital signs and the nursing notes.  Pertinent labs & imaging results that were available during my care of the patient were reviewed by me and considered in my medical decision making (see chart for details).  Clinical Course as of Jun 16 122  Sun Jun 16, 2017  0052 DG Knee Complete 4 Views Left [KH]    Clinical Course User Index [KH] Antony MaduraHumes, [place], [place]PA-C    31 year old female presents to the emergency department for evaluation of left knee pain. History consistent with patellar subluxation. There is very mild effusion as well as soft tissue swelling. Tenderness noted to the medial aspect of the knee. X-ray that showed mild flattening of the lateral tibial plateau. There is no tenderness in this location. Patient able to weight-bear without assistance  or significant pain. Doubt compression fracture.  Patient given knee sleeve for stability. I have advised follow-up with orthopedics. RICE and NSAIDs indicated with return if symptoms worsen. Patient discharged in stable condition with no unaddressed concerns. Instructions provided in written Arabic.   Final Clinical Impressions(s) / ED Diagnoses   Final diagnoses:  Patellar subluxation, left, initial encounter    New Prescriptions New Prescriptions   NAPROXEN (NAPROSYN) 500 MG TABLET    Take 1 tablet (500 mg total) by mouth 2 (two) times daily.     Antony MaduraHumes, Alaylah [place]ington, PA-C 06/16/17 0130    Bethann BerkshireZammit, Joseph, MD 06/16/17 [phone]

## 2017-12-31 NOTE — L&D Delivery Note (Signed)
Patient: Allison Holmes MRN: [id number]  GBS status: Positive, IAP given PCN x2  Patient is a 32 y.o. now G2P2 s/p NSVD at [redacted]w[redacted]d, who was admitted for SOL. Unknown time of ROM prior to delivery.   Delivery Note At 5:40 PM a viable female was delivered via Vaginal, Spontaneous (Presentation: ROA).  APGAR: 9, 9; weight pending.   Placenta status:spontaneous , intact.  Cord: 3 vessel with the following complications: none.  Cord pH: N/A  Anesthesia:  Epidural  Episiotomy: None Lacerations: 1st degree Suture Repair: 2.0 3.0 vicryl  Est. Blood Loss (mL): 350  Head delivered ROA. No nuchal cord present. Shoulder and body delivered in usual fashion. Infant with spontaneous cry, placed on mother's abdomen, dried and bulb suctioned. Cord clamped x 2 after 1-minute delay, and cut by family member. Cord blood drawn. Placenta delivered spontaneously with gentle cord traction. Fundus firm with massage and Pitocin. Perineum inspected and found to have 1st degree laceration which was repaired with 2.0 and 3.0 vicryl with good hemostasis achieved.  Mom to postpartum.  Baby to Couplet care / Skin to Skin.  De Hollingshead 09/08/2018, 6:15 PM

## 2018-02-08 ENCOUNTER — Inpatient Hospital Stay (HOSPITAL_COMMUNITY)
Admission: AD | Admit: 2018-02-08 | Discharge: 2018-02-08 | Disposition: A | Discharge status: Home / Self Care | Payer: Medicaid Other | Source: Ambulatory Visit | Attending: Obstetrics and Gynecology | Admitting: Obstetrics and Gynecology

## 2018-02-08 DIAGNOSIS — N912 Amenorrhea, unspecified: Secondary | ICD-10-CM | POA: Diagnosis present

## 2018-02-08 NOTE — MAU Note (Signed)
Pt reports she is here for a pregnancy test, no other symptoms/complaints

## 2018-02-08 NOTE — MAU Provider Note (Signed)
Ms. Allison Holmes is a 32 y.o. G1P1001 who present to MAU today for pregnancy confirmation. She denies abdominal pain or vaginal bleeding. She has had a +UPT at home. Arabic int#140004 used for the entire encounter.   BP 106/61 (BP Location: Right Arm)   Pulse 84   Temp 97.6 F (36.4 C) (Oral)   Resp 18   Ht 5' 8.11" (1.73 m)   Wt 139 lb (63 kg)   BMI 21.07 kg/m  CONSTITUTIONAL: Well-developed, well-nourished female in no acute distress.  CARDIOVASCULAR: Normal heart rate noted RESPIRATORY: Effort and breath sounds normal GASTROINTESTINAL:Soft, no distention noted.  No tenderness, rebound or guarding.  SKIN: Skin is warm and dry. No rash noted. Not diaphoretic. No erythema. No pallor. PSYCHIATRIC: Normal mood and affect. Normal behavior. Normal judgment and thought content.  MDM Medical screening exam complete Patient does not endorse any symptoms concerning for ectopic pregnancy or pregnancy related complication today.   A:  Amenorrhea  P: Discharge home Patient advised that she can present as a walk-in to [facility] for a pregnancy test M-Th between 8am-4pm or Friday between 8am -11am Reasons to return to MAU reviewed  Patient may return to MAU as needed or if her condition were to change or worsen  Armando ReichertHogan, Heather D, CNM 02/08/2018 3:29 PM

## 2018-02-10 ENCOUNTER — Encounter: Payer: Self-pay | Admitting: *Deleted

## 2018-02-10 ENCOUNTER — Ambulatory Visit: Payer: Self-pay | Admitting: Physician Assistant

## 2018-02-10 ENCOUNTER — Ambulatory Visit: Payer: Self-pay | Admitting: *Deleted

## 2018-02-10 DIAGNOSIS — Z3201 Encounter for pregnancy test, result positive: Secondary | ICD-10-CM | POA: Diagnosis not present

## 2018-02-10 DIAGNOSIS — Z3200 Encounter for pregnancy test, result unknown: Secondary | ICD-10-CM

## 2018-02-10 LAB — POCT PREGNANCY, URINE: PREG TEST UR: POSITIVE — AB

## 2018-02-10 NOTE — Progress Notes (Signed)
Here for pregnancy test which was positive. States planning to get pregnant. States LMP 12/10/17 and has periods monthly and is sure of date. So today is 8wk6d with EDD 09/16/18. States plans to get prenatal care here. Reviewed meds with her. Encouraged her to take prenatal vitamins.

## 2018-03-12 ENCOUNTER — Encounter: Payer: Self-pay | Admitting: Medical

## 2018-03-21 ENCOUNTER — Encounter: Payer: Self-pay | Admitting: Family Medicine

## 2018-03-21 ENCOUNTER — Ambulatory Visit (INDEPENDENT_AMBULATORY_CARE_PROVIDER_SITE_OTHER): Payer: Medicaid Other | Admitting: Advanced Practice Midwife

## 2018-03-21 ENCOUNTER — Other Ambulatory Visit (HOSPITAL_COMMUNITY)
Admission: RE | Admit: 2018-03-21 | Discharge: 2018-03-21 | Disposition: A | Discharge status: Home / Self Care | Payer: Medicaid Other | Source: Ambulatory Visit | Attending: Advanced Practice Midwife | Admitting: Advanced Practice Midwife

## 2018-03-21 ENCOUNTER — Encounter: Payer: Self-pay | Admitting: Advanced Practice Midwife

## 2018-03-21 VITALS — BP 100/56 | HR 76 | Wt 130.7 lb

## 2018-03-21 DIAGNOSIS — Z3A00 Weeks of gestation of pregnancy not specified: Secondary | ICD-10-CM | POA: Diagnosis not present

## 2018-03-21 DIAGNOSIS — Z789 Other specified health status: Secondary | ICD-10-CM

## 2018-03-21 DIAGNOSIS — Z3482 Encounter for supervision of other normal pregnancy, second trimester: Secondary | ICD-10-CM

## 2018-03-21 DIAGNOSIS — O219 Vomiting of pregnancy, unspecified: Secondary | ICD-10-CM

## 2018-03-21 DIAGNOSIS — Z3480 Encounter for supervision of other normal pregnancy, unspecified trimester: Secondary | ICD-10-CM | POA: Insufficient documentation

## 2018-03-21 DIAGNOSIS — Z23 Encounter for immunization: Secondary | ICD-10-CM

## 2018-03-21 LAB — POCT URINALYSIS DIP (DEVICE)
Glucose, UA: NEGATIVE mg/dL
HGB URINE DIPSTICK: NEGATIVE
Ketones, ur: NEGATIVE mg/dL
Nitrite: NEGATIVE
PROTEIN: NEGATIVE mg/dL
Urobilinogen, UA: 0.2 mg/dL (ref 0.0–1.0)
pH: 5.5 (ref 5.0–8.0)

## 2018-03-21 MED ORDER — DOXYLAMINE-PYRIDOXINE 10-10 MG PO TBEC: DELAYED_RELEASE_TABLET | ORAL | 5 refills | Status: DC

## 2018-03-21 NOTE — Progress Notes (Signed)
Addendum: PMH form completed. 

## 2018-03-21 NOTE — Patient Instructions (Signed)

## 2018-03-21 NOTE — Progress Notes (Signed)
Gave Ob packet.  Not babyscripts candiate. Declined my chart. Scheduled ultrasound. Signed up for gentic testing.

## 2018-03-21 NOTE — Progress Notes (Signed)
   PRENATAL VISIT NOTE  Subjective:  Allison Holmes is a 32 y.o. G2P1001 at 847w3d being seen today for initial prenatal visit.  She is currently monitored for the following issues for this low-risk pregnancy and has Normal pregnancy in third trimester; SVD (spontaneous vaginal delivery); and Supervision of other normal pregnancy, antepartum on their problem list.  Patient reports nausea.  Contractions: Irregular. Vag. Bleeding: None.  Movement: Present. Denies leaking of fluid.   The following portions of the patient's history were reviewed and updated as appropriate: allergies, current medications, past family history, past medical history, past social history, past surgical history and problem list. Problem list updated.  Objective:   Vitals:   03/21/18 1130  BP: (!) 100/56  Pulse: 76    Fetal Status: Fetal Heart Rate (bpm): 152   Movement: Present     VS reviewed, nursing note reviewed,  Constitutional: well developed, well nourished, no distress HEENT: normocephalic CV: normal rate Pulm/chest wall: normal effort Breast Exam:  right breast normal without mass, skin or nipple changes or axillary nodes, left breast normal without mass, skin or nipple changes or axillary nodes Abdomen: soft Neuro: alert and oriented x 3 Skin: warm, dry Psych: affect normal Pelvic exam: Cervix pink, visually closed, without lesion, scant white creamy discharge, vaginal walls and external genitalia normal Bimanual exam: Cervix 0/long/high, firm, anterior, neg CMT, uterus nontender, ~14 week size, adnexa without tenderness, enlargement, or mass    Assessment and Plan:  Pregnancy: G2P1001 at 397w3d  1. Supervision of other normal pregnancy, antepartum  - Obstetric Panel, Including HIV - Hemoglobinopathy Evaluation - SMN1 COPY NUMBER ANALYSIS (SMA Carrier Screen) - Cytology - PAP - Genetic Screening - Flu Vaccine QUAD 36+ mos IM - US MFM OB COMP + 14 WK; Future  2. N/V of pregnancy --Pt  reports nausea with vomiting 2-3 times daily.  --Small frequent meals, bland foods, sip fluids as much as possible. Try ginger (ginger ale, ginger candy etc). --Diclegis Rx sent, discussed starting as bedtime dose, adding additional daily doses PRN up to 4 tablets daily.  3. Language barrier --Hospital approved Arabic interpreter present for today's visit.  Preterm labor symptoms and general obstetric precautions including but not limited to vaginal bleeding, contractions, leaking of fluid and fetal movement were reviewed in detail with the patient. Please refer to After Visit Summary for other counseling recommendations.  Return in about 1 month (around 04/18/2018).   Sharen CounterLisa Leftwich-Kirby, CNM

## 2018-03-22 DIAGNOSIS — Z789 Other specified health status: Secondary | ICD-10-CM | POA: Insufficient documentation

## 2018-03-24 ENCOUNTER — Encounter: Payer: Self-pay | Admitting: *Deleted

## 2018-03-25 LAB — HEMOGLOBINOPATHY EVALUATION
Ferritin: 26 ng/mL (ref 15–150)
HGB S: 0 %
Hgb A2 Quant: 2.7 % (ref 1.8–3.2)
Hgb A: 97.3 % (ref 96.4–98.8)
Hgb C: 0 %
Hgb F Quant: 0 % (ref 0.0–2.0)
Hgb Solubility: NEGATIVE
Hgb Variant: 0 %

## 2018-03-25 LAB — OBSTETRIC PANEL, INCLUDING HIV
ANTIBODY SCREEN: NEGATIVE
BASOS: 0 %
Basophils Absolute: 0 10*3/uL (ref 0.0–0.2)
EOS (ABSOLUTE): 0.2 10*3/uL (ref 0.0–0.4)
Eos: 2 %
HEMATOCRIT: 39.3 % (ref 34.0–46.6)
HEP B S AG: NEGATIVE
HIV SCREEN 4TH GENERATION: NONREACTIVE
Hemoglobin: 13.2 g/dL (ref 11.1–15.9)
Immature Grans (Abs): 0 10*3/uL (ref 0.0–0.1)
Immature Granulocytes: 0 %
Lymphocytes Absolute: 2.7 10*3/uL (ref 0.7–3.1)
Lymphs: 29 %
MCH: 27.8 pg (ref 26.6–33.0)
MCHC: 33.6 g/dL (ref 31.5–35.7)
MCV: 83 fL (ref 79–97)
MONOCYTES: 4 %
Monocytes Absolute: 0.4 10*3/uL (ref 0.1–0.9)
NEUTROS ABS: 6.2 10*3/uL (ref 1.4–7.0)
Neutrophils: 65 %
Platelets: 227 10*3/uL (ref 150–379)
RBC: 4.75 x10E6/uL (ref 3.77–5.28)
RDW: 14.2 % (ref 12.3–15.4)
RPR: NONREACTIVE
RUBELLA: 27.3 {index} (ref >0.99)
Rh Factor: POSITIVE
WBC: 9.5 10*3/uL (ref 3.4–10.8)

## 2018-03-25 LAB — CYTOLOGY - PAP
Adequacy: ABSENT
Chlamydia: NEGATIVE
Diagnosis: NEGATIVE
HPV (WINDOPATH): NOT DETECTED
Neisseria Gonorrhea: NEGATIVE

## 2018-03-28 LAB — SMN1 COPY NUMBER ANALYSIS (SMA CARRIER SCREENING)

## 2018-04-01 ENCOUNTER — Encounter: Payer: Self-pay | Admitting: Physician Assistant

## 2018-04-02 ENCOUNTER — Encounter: Payer: Self-pay | Admitting: *Deleted

## 2018-04-21 ENCOUNTER — Encounter: Payer: Self-pay | Admitting: Advanced Practice Midwife

## 2018-04-21 ENCOUNTER — Ambulatory Visit (INDEPENDENT_AMBULATORY_CARE_PROVIDER_SITE_OTHER): Payer: Medicaid Other | Admitting: Advanced Practice Midwife

## 2018-04-21 VITALS — BP 121/72 | HR 83 | Wt 135.8 lb

## 2018-04-21 DIAGNOSIS — Z3480 Encounter for supervision of other normal pregnancy, unspecified trimester: Secondary | ICD-10-CM

## 2018-04-21 MED ORDER — PRENATAL VITAMINS 0.8 MG PO TABS: 1.0000 | ORAL_TABLET | Freq: Every day | ORAL | 12 refills | Status: DC

## 2018-04-21 MED ORDER — ONDANSETRON 4 MG PO TBDP: 4.0000 mg | ORAL_TABLET | Freq: Three times a day (TID) | ORAL | 3 refills | Status: DC | PRN

## 2018-04-21 MED ORDER — TERCONAZOLE 0.4 % VA CREA: 1.0000 | TOPICAL_CREAM | Freq: Every day | VAGINAL | 0 refills | Status: DC

## 2018-04-21 NOTE — Progress Notes (Signed)
   PRENATAL VISIT NOTE  Subjective:  Allison Holmes is a 32 y.o. G2P1001 at 7953w6d being seen today for ongoing prenatal care.  She is currently monitored for the following issues for this low-risk pregnancy and has Supervision of other normal pregnancy, antepartum and Language barrier affecting health care on their problem list.  Patient reports no complaints.  Contractions: Not present. Vag. Bleeding: None.  Movement: Present. Denies leaking of fluid.   The following portions of the patient's history were reviewed and updated as appropriate: allergies, current medications, past family history, past medical history, past social history, past surgical history and problem list. Problem list updated.  Objective:   Vitals:   04/21/18 1132  BP: 121/72  Pulse: 83  Weight: 135 lb 12.8 oz (61.6 kg)    Fetal Status: Fetal Heart Rate (bpm): 145   Movement: Present     General:  Alert, oriented and cooperative. Patient is in no acute distress.  Skin: Skin is warm and dry. No rash noted.   Cardiovascular: Normal heart rate noted  Respiratory: Normal respiratory effort, no problems with respiration noted  Abdomen: Soft, gravid, appropriate for gestational age.  Pain/Pressure: Absent     Pelvic: Cervical exam deferred        Extremities: Normal range of motion.  Edema: None  Mental Status: Normal mood and affect. Normal behavior. Normal judgment and thought content.   Assessment and Plan:  Pregnancy: G2P1001 at 7153w6d  1. Supervision of other normal pregnancy, antepartum - Anatomy US tomorrow  - Panorama unable to be completed due to insufficient blood in the tubes. Patient would like re-draw today. - Patient was unable to fill RX for dicelgis 2/2 cost, will send in RX for zofran - Patient also states that a RX was sent in for "an infection".  I do not see any rx on her current med list. However, I do see there was yeast on her pap. Sent in RX for terazol.   Preterm labor symptoms and general  obstetric precautions including but not limited to vaginal bleeding, contractions, leaking of fluid and fetal movement were reviewed in detail with the patient. Please refer to After Visit Summary for other counseling recommendations.  Return in about 1 month (around 05/19/2018).  Future Appointments  Date Time Provider Department Center  04/22/2018 10:45 AM [facility] US 2 [facility]    Thressa ShellerHeather Hogan, CNM

## 2018-04-21 NOTE — Progress Notes (Signed)
[employer] Interpreter

## 2018-04-22 ENCOUNTER — Other Ambulatory Visit: Payer: Self-pay | Admitting: Advanced Practice Midwife

## 2018-04-22 ENCOUNTER — Ambulatory Visit (HOSPITAL_COMMUNITY)
Admission: RE | Admit: 2018-04-22 | Discharge: 2018-04-22 | Disposition: A | Discharge status: Home / Self Care | Payer: Medicaid Other | Source: Ambulatory Visit | Attending: Advanced Practice Midwife | Admitting: Advanced Practice Midwife

## 2018-04-22 DIAGNOSIS — Z3A19 19 weeks gestation of pregnancy: Secondary | ICD-10-CM

## 2018-04-22 DIAGNOSIS — Z363 Encounter for antenatal screening for malformations: Secondary | ICD-10-CM | POA: Insufficient documentation

## 2018-04-22 DIAGNOSIS — Z3482 Encounter for supervision of other normal pregnancy, second trimester: Secondary | ICD-10-CM | POA: Diagnosis not present

## 2018-04-22 DIAGNOSIS — Z3480 Encounter for supervision of other normal pregnancy, unspecified trimester: Secondary | ICD-10-CM

## 2018-04-24 ENCOUNTER — Other Ambulatory Visit: Payer: Self-pay | Admitting: Advanced Practice Midwife

## 2018-05-02 ENCOUNTER — Encounter: Payer: Self-pay | Admitting: *Deleted

## 2018-05-07 ENCOUNTER — Encounter: Payer: Self-pay | Admitting: *Deleted

## 2018-05-14 ENCOUNTER — Ambulatory Visit (INDEPENDENT_AMBULATORY_CARE_PROVIDER_SITE_OTHER): Payer: Medicaid Other

## 2018-05-14 VITALS — BP 110/73 | HR 77 | Wt 137.4 lb

## 2018-05-14 DIAGNOSIS — Z3480 Encounter for supervision of other normal pregnancy, unspecified trimester: Secondary | ICD-10-CM

## 2018-05-14 MED ORDER — PRENATAL 27-0.8 MG PO TABS: 1.0000 | ORAL_TABLET | Freq: Every day | ORAL | 11 refills | Status: DC

## 2018-05-14 NOTE — Patient Instructions (Signed)

## 2018-05-14 NOTE — Progress Notes (Signed)
l  PRENATAL VISIT NOTE  Subjective:  Allison Holmes is a 32 y.o. G2P1001 at [redacted]w[redacted]d being seen today for ongoing prenatal care.  She is currently monitored for the following issues for this low-risk pregnancy and has Supervision of other normal pregnancy, antepartum and Language barrier affecting health care on their problem list.  Patient reports no complaints.  Contractions: Not present. Vag. Bleeding: None.  Movement: Present. Denies leaking of fluid.   The following portions of the patient's history were reviewed and updated as appropriate: allergies, current medications, past family history, past medical history, past social history, past surgical history and problem list. Problem list updated.  Objective:   Vitals:   05/14/18 1049  BP: 110/73  Pulse: 77  Weight: 137 lb 6.4 oz (62.3 kg)    Fetal Status: Fetal Heart Rate (bpm): 155 Fundal Height: 22 cm Movement: Present     General:  Alert, oriented and cooperative. Patient is in no acute distress.  Skin: Skin is warm and dry. No rash noted.   Cardiovascular: Normal heart rate noted  Respiratory: Normal respiratory effort, no problems with respiration noted  Abdomen: Soft, gravid, appropriate for gestational age.  Pain/Pressure: Absent     Pelvic: Cervical exam deferred        Extremities: Normal range of motion.  Edema: None  Mental Status: Normal mood and affect. Normal behavior. Normal judgment and thought content.   Assessment and Plan:  Pregnancy: G2P1001 at [redacted]w[redacted]d  1. Supervision of other normal pregnancy, antepartum - No complaints. Routine care  Preterm labor symptoms and general obstetric precautions including but not limited to vaginal bleeding, contractions, leaking of fluid and fetal movement were reviewed in detail with the patient. Please refer to After Visit Summary for other counseling recommendations.  Return in about 1 month (around 06/11/2018) for Return OB visit.  Rolm Bookbinder, CNM 05/14/18 11:06  AM

## 2018-06-10 ENCOUNTER — Ambulatory Visit (INDEPENDENT_AMBULATORY_CARE_PROVIDER_SITE_OTHER): Payer: Medicaid Other | Admitting: Medical

## 2018-06-10 ENCOUNTER — Encounter: Payer: Self-pay | Admitting: Medical

## 2018-06-10 VITALS — BP 105/63 | HR 85 | Wt 144.4 lb

## 2018-06-10 DIAGNOSIS — Z3482 Encounter for supervision of other normal pregnancy, second trimester: Secondary | ICD-10-CM

## 2018-06-10 DIAGNOSIS — Z3480 Encounter for supervision of other normal pregnancy, unspecified trimester: Secondary | ICD-10-CM

## 2018-06-10 NOTE — Patient Instructions (Addendum)
Second Trimester of Pregnancy The second trimester is from week 13 through week 28, month 4 through 6. This is often the time in pregnancy that you feel your best. Often times, morning sickness has lessened or quit. You may have more energy, and you may get hungry more often. Your unborn baby (fetus) is growing rapidly. At the end of the sixth month, he or she is about 9 inches long and weighs about 1 pounds. You will likely feel the baby move (quickening) between 18 and 20 weeks of pregnancy.  Research childbirth classes and hospital preregistration at [email or web address]  Follow these instructions at home:  Avoid all smoking, herbs, and alcohol. Avoid drugs not approved by your doctor.  Do not use any tobacco products, including cigarettes, chewing tobacco, and electronic cigarettes. If you need help quitting, ask your doctor. You may get counseling or other support to help you quit.  Only take medicine as told by your doctor. Some medicines are safe and some are not during pregnancy.  Exercise only as told by your doctor. Stop exercising if you start having cramps.  Eat regular, healthy meals.  Wear a good support bra if your breasts are tender.  Do not use hot tubs, steam rooms, or saunas.  Wear your seat belt when driving.  Avoid raw meat, uncooked cheese, and liter boxes and soil used by cats.  Take your prenatal vitamins.  Take 1500-2000 milligrams of calcium daily starting at the 20th week of pregnancy until you deliver your baby.  Try taking medicine that helps you poop (stool softener) as needed, and if your doctor approves. Eat more fiber by eating fresh fruit, vegetables, and whole grains. Drink enough fluids to keep your pee (urine) clear or pale yellow.  Take warm water baths (sitz baths) to soothe pain or discomfort caused by hemorrhoids. Use hemorrhoid cream if your doctor approves.  If you have puffy, bulging veins (varicose veins), wear support hose. Raise  (elevate) your feet for 15 minutes, 3-4 times a day. Limit salt in your diet.  Avoid heavy lifting, wear low heals, and sit up straight.  Rest with your legs raised if you have leg cramps or low back pain.  Visit your dentist if you have not gone during your pregnancy. Use a soft toothbrush to brush your teeth. Be gentle when you floss.  You can have sex (intercourse) unless your doctor tells you not to.  Go to your doctor visits.  Get help if:  You feel dizzy.  You have mild cramps or pressure in your lower belly (abdomen).  You have a nagging pain in your belly area.  You continue to feel sick to your stomach (nauseous), throw up (vomit), or have watery poop (diarrhea).  You have bad smelling fluid coming from your vagina.  You have pain with peeing (urination). Get help right away if:  You have a fever.  You are leaking fluid from your vagina.  You have spotting or bleeding from your vagina.  You have severe belly cramping or pain.  You lose or gain weight rapidly.  You have trouble catching your breath and have chest pain.  You notice sudden or extreme puffiness (swelling) of your face, hands, ankles, feet, or legs.  You have not felt the baby move in over an hour.  You have severe headaches that do not go away with medicine.  You have vision changes. This information is not intended to replace advice given to you by your health care provider. Make   sure you discuss any questions you have with your health care provider. Document Released: 03/13/2010 Document Revised: 05/24/2016 Document Reviewed: 02/17/2013 Elsevier Interactive Patient Education  2017 [place] [address] [place], [place]  [address] Phone - [phone]   Fax - [phone]  [facility] [address] [place], [place] [address] Phone - [phone]   Fax -  [place] [address] [place] [address], [place]  [address] Phone - [phone]   Fax - [phone]  [place] [place] [address] [place], [place]  [address] Phone - [phone]   Fax - [phone]  [place] [address] [place], [place]  [address] Phone - [phone]   Fax - [phone]  [facility] [address] [place], [place]  [address] Phone - [phone]   Fax - [place] [address] [place], [place]  [address] Phone - [phone]   Fax - [phone]  [place] [address] [place], [address] [place], [place]  [address] Phone - [phone]   Fax - [place] [address] [place], [place]  [address] Phone - [phone]   Fax - [phone] [facility] [place]. [address] [place], [place]  [address] Phone - [phone]   Fax - [phone]  [place] [address] [place], [place]  [address] Phone - [phone]   Fax - [phone]  [facility] [place], [place], [place]  [address] Phone - [phone]   Fax - [place] [address] [place], [place]  [address] Phone - [phone]   Fax - [phone]  [facility] [address] [place], [place]  [address] Phone - [place], [place]  [address] Phone - [phone]   Fax - [place] [address], [place], [place]  [address] Phone - [phone]   Fax - [phone]  [place] [address] [place], [place]  [address] Phone - [phone]   Fax - [place]. [place], [place]  [address] Phone - [phone]    Fax - [place], [place], [place]  [address] Phone - [phone]   Fax - [place] [address] [place], [place]  [address] Phone - [phone]   Fax - [phone]  [place] RUBIN [address], [place], [place]  [address] Phone - [phone]   Fax - [place] [address], [place], [place]  [address] Phone - [phone]   Fax - [phone]  [place] [address] [place], [place]  [address] Phone - [phone]   Fax - [phone] Arnaldo Natal [address] [place], [place]  [address] Phone - [phone]   Fax - [place] [address] [place], [place]  [address] Phone - [phone]   Fax - [place] [address] [place],   [address] Phone - [phone]   Fax - [phone]  [place] -  [place] Charlene Flemming MD [address] [place] [address] Phone [phone]  Fax [phone]  Places to hWyvonne Lenzave your son circumcised:    [facility] [phone] $480 while you are in hospital  [facility] [phone] $244 by 4 wks  [facility] [phone] $175 by 2 wks  [facility] [phone] $250 by 7 days [facility] [phone] $269 by 4 wks  These prices sometimes change but are roughly what you can expect to pay. Please call and confirm pricing.   Circumcision is considered an elective/non-medically necessary procedure. There are many reasons parents decide to have their sons circumsized. During the first year of life circumcised males have a reduced risk of urinary tract infections but after this year the  rates between circumcised males and uncircumcised males are the same.  It is safe to have your son circumcised outside of the hospital and the places above perform them regularly.   Deciding about Circumcision in Baby Boys  (Up-to-date The Basics)  What is circumcision?  Circumcision is a surgery that removes the skin that covers the tip of the penis, called the "foreskin" Circumcision is usually done when a boy is between 471 and 6210 days old. In the [place], circumcision is common. In some other countries, fewer boys are circumcised. Circumcision is a common tradition in some religions.  Should I have my baby boy circumcised?  There is no easy answer. Circumcision has some benefits. But it also has risks. After talking with your doctor, you will have to decide for yourself what is right for your family.  What are the benefits of circumcision?  Circumcised boys seem to have slightly lower rates of: ?Urinary tract infections ?Swelling of the opening at the tip of the penis Circumcised men seem to have slightly lower rates of: ?Urinary tract infections ?Swelling of the opening at the tip of the penis ?Penis cancer ?HIV and other infections that you catch during sex ?Cervical cancer in the women they have sex with Even so, in the [place], the risks of these problems are small - even in boys and men who have not been circumcised. Plus, boys and men who are not circumcised can reduce these extra risks by: ?Cleaning their penis well ?Using condoms during sex  What are the risks of circumcision?  Risks include: ?Bleeding or infection from the surgery ?Damage to or amputation of the penis ?A chance that the doctor will cut off too much or not enough of the foreskin ?A chance that sex won't feel as good later in life Only about 1 out of every 200 circumcisions leads to problems. There is also a chance that your health insurance won't pay for circumcision.  How is circumcision  done in baby boys?  First, the baby gets medicine for pain relief. This might be a cream on the skin or a shot into the base of the penis. Next, the doctor cleans the baby's penis well. Then he or she uses special tools to cut off the foreskin. Finally, the doctor wraps a bandage (called gauze) around the baby's penis. If you have your baby circumcised, his doctor or nurse will give you instructions on how to care for him after the surgery. It is important that you follow those instructions carefully.

## 2018-06-10 NOTE — Progress Notes (Signed)
   PRENATAL VISIT NOTE  Subjective:  Niva Rybicki is a 32 y.o. G2P1001 at 3518w0d being seen today for ongoing prenatal care.  She is currently monitored for the following issues for this low-risk pregnancy and has Supervision of other normal pregnancy, antepartum and Language barrier affecting health care on their problem list.  Patient reports no complaints.  Contractions: Not present. Vag. Bleeding: None.  Movement: Present. Denies leaking of fluid.   The following portions of the patient's history were reviewed and updated as appropriate: allergies, current medications, past family history, past medical history, past social history, past surgical history and problem list. Problem list updated.  Objective:   Vitals:   06/10/18 1029  BP: 105/63  Pulse: 85  Weight: 144 lb 6.4 oz (65.5 kg)    Fetal Status: Fetal Heart Rate (bpm): 152 Fundal Height: 25 cm Movement: Present     General:  Alert, oriented and cooperative. Patient is in no acute distress.  Skin: Skin is warm and dry. No rash noted.   Cardiovascular: Normal heart rate noted  Respiratory: Normal respiratory effort, no problems with respiration noted  Abdomen: Soft, gravid, appropriate for gestational age.  Pain/Pressure: Absent     Pelvic: Cervical exam deferred        Extremities: Normal range of motion.  Edema: None  Mental Status: Normal mood and affect. Normal behavior. Normal judgment and thought content.   Assessment and Plan:  Pregnancy: G2P1001 at 2218w0d  1. Supervision of other normal pregnancy, antepartum - Doing well - Dicussed need for fasting labs at next appointment, patient verbalizes understanding  - Patient requested refill for PNV, advised that she should have plenty of refills at the pharmacy   Preterm labor symptoms and general obstetric precautions including but not limited to vaginal bleeding, contractions, leaking of fluid and fetal movement were reviewed in detail with the patient. Please refer  to After Visit Summary for other counseling recommendations.  Return in about 2 weeks (around 06/24/2018) for LOB, 28 week labs (fasting).   Vonzella NippleJulie Shondell Fabel, PA-C

## 2018-06-24 ENCOUNTER — Other Ambulatory Visit: Payer: Self-pay | Admitting: *Deleted

## 2018-06-24 DIAGNOSIS — Z3480 Encounter for supervision of other normal pregnancy, unspecified trimester: Secondary | ICD-10-CM

## 2018-06-25 ENCOUNTER — Other Ambulatory Visit: Payer: Medicaid Other

## 2018-06-25 ENCOUNTER — Ambulatory Visit (INDEPENDENT_AMBULATORY_CARE_PROVIDER_SITE_OTHER): Payer: Medicaid Other | Admitting: Medical

## 2018-06-25 ENCOUNTER — Encounter: Payer: Self-pay | Admitting: Medical

## 2018-06-25 VITALS — BP 104/67 | HR 76 | Wt 147.9 lb

## 2018-06-25 DIAGNOSIS — O99891 Other specified diseases and conditions complicating pregnancy: Secondary | ICD-10-CM

## 2018-06-25 DIAGNOSIS — Z23 Encounter for immunization: Secondary | ICD-10-CM | POA: Diagnosis not present

## 2018-06-25 DIAGNOSIS — M549 Dorsalgia, unspecified: Secondary | ICD-10-CM

## 2018-06-25 DIAGNOSIS — Z3480 Encounter for supervision of other normal pregnancy, unspecified trimester: Secondary | ICD-10-CM

## 2018-06-25 DIAGNOSIS — O9989 Other specified diseases and conditions complicating pregnancy, childbirth and the puerperium: Secondary | ICD-10-CM

## 2018-06-25 MED ORDER — CYCLOBENZAPRINE HCL 10 MG PO TABS: 10.0000 mg | ORAL_TABLET | Freq: Two times a day (BID) | ORAL | 0 refills | Status: DC | PRN

## 2018-06-25 NOTE — Progress Notes (Signed)
   PRENATAL VISIT NOTE  Subjective:  Allison Holmes is a 32 y.o. G2P1001 at 6272w1d being seen today for ongoing prenatal care.  She is currently monitored for the following issues for this low-risk pregnancy and has Supervision of other normal pregnancy, antepartum and Language barrier affecting health care on their problem list.  Patient reports backache. Patient states that she fell in the bathroom 2 days ago and hit her abdomen. She denies any bleeding, LOF or contractions and is only having back pain. She has not taken any pain medication. Due to remoteness of fall and lack of concerning symtpoms, will defer NST/US today. Patient counseled on when to return if symptoms worsen.  Contractions: Not present. Vag. Bleeding: None.  Movement: Present. Denies leaking of fluid.   The following portions of the patient's history were reviewed and updated as appropriate: allergies, current medications, past family history, past medical history, past social history, past surgical history and problem list. Problem list updated.  Objective:   Vitals:   06/25/18 1029  BP: 104/67  Pulse: 76  Weight: 147 lb 14.4 oz (67.1 kg)    Fetal Status: Fetal Heart Rate (bpm): 152 Fundal Height: 28 cm Movement: Present     General:  Alert, oriented and cooperative. Patient is in no acute distress.  Skin: Skin is warm and dry. No rash noted.   Cardiovascular: Normal heart rate noted  Respiratory: Normal respiratory effort, no problems with respiration noted  Abdomen: Soft, gravid, appropriate for gestational age.  Pain/Pressure: Present     Pelvic: Cervical exam deferred        Extremities: Normal range of motion.  Edema: None  Mental Status: Normal mood and affect. Normal behavior. Normal judgment and thought content.   Assessment and Plan:  Pregnancy: G2P1001 at 3272w1d  1. Supervision of other normal pregnancy, antepartum - 2 hr GTT, CBC, HIV, RPR today - AFP, Serum, Open Spina Bifida - Tdap vaccine greater  than or equal to 7yo IM  2. Back pain affecting pregnancy in third trimester - Patient advised to use abdominal binder at work, take frequent breaks from standing and avoid heavy lifting, especially her son  - Tylenol PRN for pain advised during the day, patient requesting something more as well, Rx sent as noted below - cyclobenzaprine (FLEXERIL) 10 MG tablet; Take 1 tablet (10 mg total) by mouth 2 (two) times daily as needed for muscle spasms.  Dispense: 20 tablet; Refill: 0  Preterm labor symptoms and general obstetric precautions including but not limited to vaginal bleeding, contractions, leaking of fluid and fetal movement were reviewed in detail with the patient. Please refer to After Visit Summary for other counseling recommendations.  Return in about 2 weeks (around 07/09/2018) for LOB.   Vonzella NippleJulie Youcef Klas, PA-C

## 2018-06-25 NOTE — Patient Instructions (Addendum)
Research childbirth classes and hospital preregistration at [email or web address]  Fetal Movement Counts Patient Name: ________________________________________________ Patient Due Date: ____________________ What is a fetal movement count? A fetal movement count is the number of times that you feel your baby move during a certain amount of time. This may also be called a fetal kick count. A fetal movement count is recommended for every pregnant woman. You may be asked to start counting fetal movements as early as week 28 of your pregnancy. Pay attention to when your baby is most active. You may notice your baby's sleep and wake cycles. You may also notice things that make your baby move more. You should do a fetal movement count:  When your baby is normally most active.  At the same time each day.  A good time to count movements is while you are resting, after having something to eat and drink. How do I count fetal movements? 1. Find a quiet, comfortable area. Sit, or lie down on your side. 2. Write down the date, the start time and stop time, and the number of movements that you felt between those two times. Take this information with you to your health care visits. 3. For 2 hours, count kicks, flutters, swishes, rolls, and jabs. You should feel at least 10 movements during 2 hours. 4. You may stop counting after you have felt 10 movements. 5. If you do not feel 10 movements in 2 hours, have something to eat and drink. Then, keep resting and counting for 1 hour. If you feel at least 4 movements during that hour, you may stop counting. Contact a health care provider if:  You feel fewer than 4 movements in 2 hours.  Your baby is not moving like he or she usually does. Date: ____________ Start time: ____________ Stop time: ____________ Movements: ____________ Date: ____________ Start time: ____________ Stop time: ____________ Movements: ____________ Date: ____________ Start time: ____________  Stop time: ____________ Movements: ____________ Date: ____________ Start time: ____________ Stop time: ____________ Movements: ____________ Date: ____________ Start time: ____________ Stop time: ____________ Movements: ____________ Date: ____________ Start time: ____________ Stop time: ____________ Movements: ____________ Date: ____________ Start time: ____________ Stop time: ____________ Movements: ____________ Date: ____________ Start time: ____________ Stop time: ____________ Movements: ____________ Date: ____________ Start time: ____________ Stop time: ____________ Movements: ____________ This information is not intended to replace advice given to you by your health care provider. Make sure you discuss any questions you have with your health care provider. Document Released: 01/16/2007 Document Revised: 08/15/2016 Document Reviewed: 01/26/2016 Elsevier Interactive Patient Education  2018 Else[employer] Inc[employer]  Braxton Hicks Contractions Contractions of the uterus can occur throughout pregnancy, but they are not always a sign that you are in labor. You may have practice contractions called Braxton Hicks contractions. These false labor contractions are sometimes confused with true labor. What are Braxton Hicks contractions? Braxton Hicks contractions are tightening movements that occur in the muscles of the uterus before labor. Unlike true labor contractions, these contractions do not result in opening (dilation) and thinning of the cervix. Toward the end of pregnancy (32-34 weeks), Braxton Hicks contractions can happen more often and may become stronger. These contractions are sometimes difficult to tell apart from true labor because they can be very uncomfortable. You should not feel embarrassed if you go to the hospital with false labor. Sometimes, the only way to tell if you are in true labor is for your health care provider to look for changes in the cervix. The health care provider will   do a physical  exam and may monitor your contractions. If you are not in true labor, the exam should show that your cervix is not dilating and your water has not broken. If there are other health problems associated with your pregnancy, it is completely safe for you to be sent home with false labor. You may continue to have Braxton Hicks contractions until you go into true labor. How to tell the difference between true labor and false labor True labor  Contractions last 30-70 seconds.  Contractions become very regular.  Discomfort is usually felt in the top of the uterus, and it spreads to the lower abdomen and low back.  Contractions do not go away with walking.  Contractions usually become more intense and increase in frequency.  The cervix dilates and gets thinner. False labor  Contractions are usually shorter and not as strong as true labor contractions.  Contractions are usually irregular.  Contractions are often felt in the front of the lower abdomen and in the groin.  Contractions may go away when you walk around or change positions while lying down.  Contractions get weaker and are shorter-lasting as time goes on.  The cervix usually does not dilate or become thin. Follow these instructions at home:  Take over-the-counter and prescription medicines only as told by your health care provider.  Keep up with your usual exercises and follow other instructions from your health care provider.  Eat and drink lightly if you think you are going into labor.  If Braxton Hicks contractions are making you uncomfortable: ? Change your position from lying down or resting to walking, or change from walking to resting. ? Sit and rest in a tub of warm water. ? Drink enough fluid to keep your urine pale yellow. Dehydration may cause these contractions. ? Do slow and deep breathing several times an hour.  Keep all follow-up prenatal visits as told by your health care provider. This is  important. Contact a health care provider if:  You have a fever.  You have continuous pain in your abdomen. Get help right away if:  Your contractions become stronger, more regular, and closer together.  You have fluid leaking or gushing from your vagina.  You pass blood-tinged mucus (bloody show).  You have bleeding from your vagina.  You have low back pain that you never had before.  You feel your baby's head pushing down and causing pelvic pressure.  Your baby is not moving inside you as much as it used to. Summary  Contractions that occur before labor are called Braxton Hicks contractions, false labor, or practice contractions.  Braxton Hicks contractions are usually shorter, weaker, farther apart, and less regular than true labor contractions. True labor contractions usually become progressively stronger and regular and they become more frequent.  Manage discomfort from [facility] contractions by changing position, resting in a warm bath, drinking plenty of water, or practicing deep breathing. This information is not intended to replace advice given to you by your health care provider. Make sure you discuss any questions you have with your health care provider. Document Released: 05/02/2017 Document Revised: 05/02/2017 Document Reviewed: 05/02/2017 Elsevier Interactive Patient Education  2018 Elsevier Inc.    Back Pain in Pregnancy Back pain during pregnancy is common. Back pain may be caused by several factors that are related to changes during your pregnancy. Follow these instructions at home: Managing pain, stiffness, and swelling  If directed, apply ice for sudden (acute) back pain. ? Put  ice in a plastic bag. ? Place a towel between your skin and the bag. ? Leave the ice on for 20 minutes, 2-3 times per day.  If directed, apply heat to the affected area before you exercise: ? Place a towel between your skin and the heat pack or heating pad. ? Leave the heat  on for 20-30 minutes. ? Remove the heat if your skin turns bright red. This is especially important if you are unable to feel pain, heat, or cold. You may have a greater risk of getting burned. Activity  Exercise as told by your health care provider. Exercising is the best way to prevent or manage back pain.  Listen to your body when lifting. If lifting hurts, ask for help or bend your knees. This uses your leg muscles instead of your back muscles.  Squat down when picking up something from the floor. Do not bend over.  Only use bed rest as told by your health care provider. Bed rest should only be used for the most severe episodes of back pain. Standing, Sitting, and Lying Down  Do not stand in one place for long periods of time.  Use good posture when sitting. Make sure your head rests over your shoulders and is not hanging forward. Use a pillow on your lower back if necessary.  Try sleeping on your side, preferably the left side, with a pillow or two between your legs. If you are sore after a night's rest, your bed may be too soft. A firm mattress may provide more support for your back during pregnancy. General instructions  Do not wear high heels.  Eat a healthy diet. Try to gain weight within your health care provider's recommendations.  Use a maternity girdle, elastic sling, or back brace as told by your health care provider.  Take over-the-counter and prescription medicines only as told by your health care provider.  Keep all follow-up visits as told by your health care provider. This is important. This includes any visits with any specialists, such as a physical therapist. Contact a health care provider if:  Your back pain interferes with your daily activities.  You have increasing pain in other parts of your body. Get help right away if:  You develop numbness, tingling, weakness, or problems with the use of your arms or legs.  You develop severe back pain that is not  controlled with medicine.  You have a sudden change in bowel or bladder control.  You develop shortness of breath, dizziness, or you faint.  You develop nausea, vomiting, or sweating.  You have back pain that is a rhythmic, cramping pain similar to labor pains. Labor pain is usually 1-2 minutes apart, lasts for about 1 minute, and involves a bearing down feeling or pressure in your pelvis.  You have back pain and your water breaks or you have vaginal bleeding.  You have back pain or numbness that travels down your leg.  Your back pain developed after you fell.  You develop pain on one side of your back.  You see blood in your urine.  You develop skin blisters in the area of your back pain. This information is not intended to replace advice given to you by your health care provider. Make sure you discuss any questions you have with your health care provider. Document Released: 03/27/2006 Document Revised: 05/24/2016 Document Reviewed: 08/31/2015 Elsevier Interactive Patient Education  [employer]2018 Elsevier Inc.

## 2018-06-26 LAB — CBC
HEMATOCRIT: 34.6 % (ref 34.0–46.6)
Hemoglobin: 11.4 g/dL (ref 11.1–15.9)
MCH: 27.1 pg (ref 26.6–33.0)
MCHC: 32.9 g/dL (ref 31.5–35.7)
MCV: 82 fL (ref 79–97)
PLATELETS: 246 10*3/uL (ref 150–450)
RBC: 4.21 x10E6/uL (ref 3.77–5.28)
RDW: 14 % (ref 12.3–15.4)
WBC: 11.2 10*3/uL — ABNORMAL HIGH (ref 3.4–10.8)

## 2018-06-26 LAB — GLUCOSE TOLERANCE, 2 HOURS W/ 1HR
GLUCOSE, 2 HOUR: 93 mg/dL (ref 65–152)
GLUCOSE, FASTING: 69 mg/dL (ref 65–91)
Glucose, 1 hour: 97 mg/dL (ref 65–179)

## 2018-06-26 LAB — RPR: RPR: NONREACTIVE

## 2018-06-26 LAB — HIV ANTIBODY (ROUTINE TESTING W REFLEX): HIV SCREEN 4TH GENERATION: NONREACTIVE

## 2018-07-02 ENCOUNTER — Encounter: Payer: Self-pay | Admitting: General Practice

## 2018-07-02 NOTE — Progress Notes (Unsigned)
[facility] called informing us that the AFP test was cancelled because the patient would have been 28 weeks at the time of collection, the cut off is 23.6 weeks.

## 2018-07-10 ENCOUNTER — Ambulatory Visit (INDEPENDENT_AMBULATORY_CARE_PROVIDER_SITE_OTHER): Payer: Medicaid Other | Admitting: Nurse Practitioner

## 2018-07-10 VITALS — BP 110/65 | HR 90 | Wt 150.5 lb

## 2018-07-10 DIAGNOSIS — K5900 Constipation, unspecified: Secondary | ICD-10-CM | POA: Insufficient documentation

## 2018-07-10 DIAGNOSIS — O26899 Other specified pregnancy related conditions, unspecified trimester: Secondary | ICD-10-CM | POA: Insufficient documentation

## 2018-07-10 DIAGNOSIS — Z3480 Encounter for supervision of other normal pregnancy, unspecified trimester: Secondary | ICD-10-CM

## 2018-07-10 DIAGNOSIS — R12 Heartburn: Secondary | ICD-10-CM

## 2018-07-10 MED ORDER — RANITIDINE HCL 150 MG PO TABS: 150.0000 mg | ORAL_TABLET | Freq: Two times a day (BID) | ORAL | 11 refills | Status: DC

## 2018-07-10 MED ORDER — DOCUSATE CALCIUM 240 MG PO CAPS: 240.0000 mg | ORAL_CAPSULE | Freq: Every day | ORAL | 2 refills | Status: DC

## 2018-07-10 NOTE — Progress Notes (Signed)
    Subjective:  Allison Holmes is a 32 y.o. G2P1001 at 1443w2d being seen today for ongoing prenatal care.  She is currently monitored for the following issues for this low-risk pregnancy and has Supervision of other normal pregnancy, antepartum; Language barrier affecting health care; Heartburn during pregnancy, unspecified trimester; and Constipation on their problem list.   Video interpreter used for the entire visit.  Patient reports heartburn and wants medication.  Contractions: Irritability. Vag. Bleeding: None.  Movement: Present. Denies leaking of fluid.   The following portions of the patient's history were reviewed and updated as appropriate: allergies, current medications, past family history, past medical history, past social history, past surgical history and problem list. Problem list updated.  Objective:   Vitals:   07/10/18 1047  BP: 110/65  Pulse: 90  Weight: 150 lb 8 oz (68.3 kg)    Fetal Status: Fetal Heart Rate (bpm): 159 Fundal Height: 31 cm Movement: Present     General:  Alert, oriented and cooperative. Patient is in no acute distress.  Skin: Skin is warm and dry. No rash noted.   Cardiovascular: Normal heart rate noted  Respiratory: Normal respiratory effort, no problems with respiration noted  Abdomen: Soft, gravid, appropriate for gestational age. Pain/Pressure: Absent     Pelvic:   deferred        Extremities: Normal range of motion.  Edema: None  Mental Status: Normal mood and affect. Normal behavior. Normal judgment and thought content.     Assessment and Plan:  Pregnancy: G2P1001 at 6143w2d  1. Supervision of other normal pregnancy, antepartum Doing well.  Planning to breastfeed and breastfed first baby for over one year.  Plans Nexplanon for contraception.  2. Heartburn during pregnancy, unspecified trimester Prescribed Zantac and advised her to take Tums if needed.  Client drinks sodas occasionally and she notices that makes her heartburn  worse.  3. Constipation, unspecified constipation type Prescribed surfak for client as she reports some constipation.  Advised to drink lots of fluids.  Preterm labor symptoms and general obstetric precautions including but not limited to vaginal bleeding, contractions, leaking of fluid and fetal movement were reviewed in detail with the patient. Please refer to After Visit Summary for other counseling recommendations.  Return in about 2 weeks (around 07/24/2018).  Nolene BernheimERRI Marcelus Dubberly, RN, MSN, NP-BC Nurse Practitioner, [facility], [facility] 07/10/2018 11:20 AM

## 2018-07-10 NOTE — Progress Notes (Signed)
Pt states is having a lot of heartburn. 

## 2018-07-25 ENCOUNTER — Ambulatory Visit (INDEPENDENT_AMBULATORY_CARE_PROVIDER_SITE_OTHER): Payer: Medicaid Other | Admitting: Student

## 2018-07-25 DIAGNOSIS — Z3480 Encounter for supervision of other normal pregnancy, unspecified trimester: Secondary | ICD-10-CM

## 2018-07-25 MED ORDER — PRENATAL 27-0.8 MG PO TABS: 1.0000 | ORAL_TABLET | Freq: Every day | ORAL | 11 refills | Status: DC

## 2018-07-25 MED ORDER — DOCUSATE CALCIUM 240 MG PO CAPS: 240.0000 mg | ORAL_CAPSULE | Freq: Every day | ORAL | 2 refills | Status: DC

## 2018-07-25 MED ORDER — RANITIDINE HCL 150 MG PO TABS: 150.0000 mg | ORAL_TABLET | Freq: Two times a day (BID) | ORAL | 11 refills | Status: DC

## 2018-07-25 NOTE — Progress Notes (Signed)
Per interpreter patient states she wants an epidural during delivery.  She also states that she lost the Rx for the medicines and would like for us to send them to the pharmacy for her.  Zantac and Surfak.

## 2018-07-25 NOTE — Progress Notes (Signed)
   PRENATAL VISIT NOTE  Subjective:  Labrea Fern is a 32 y.o. G2P1001 at 358w3d being seen today for ongoing prenatal care.  She is currently monitored for the following issues for this low-risk pregnancy and has Supervision of other normal pregnancy, antepartum; Language barrier affecting health care; Heartburn during pregnancy, unspecified trimester; and Constipation on their problem list.  Patient reports no complaints.  Contractions: Irritability. Vag. Bleeding: None.  Movement: Present. Denies leaking of fluid.   The following portions of the patient's history were reviewed and updated as appropriate: allergies, current medications, past family history, past medical history, past social history, past surgical history and problem list. Problem list updated.  Objective:   Vitals:   07/25/18 1322  BP: 106/67  Pulse: 87  Weight: 152 lb (68.9 kg)    Fetal Status: Fetal Heart Rate (bpm): 148 Fundal Height: 34 cm Movement: Present     General:  Alert, oriented and cooperative. Patient is in no acute distress.  Skin: Skin is warm and dry. No rash noted.   Cardiovascular: Normal heart rate noted  Respiratory: Normal respiratory effort, no problems with respiration noted  Abdomen: Soft, gravid, appropriate for gestational age.  Pain/Pressure: Absent     Pelvic: Cervical exam deferred        Extremities: Normal range of motion.  Edema: None  Mental Status: Normal mood and affect. Normal behavior. Normal judgment and thought content.   Assessment and Plan:  Pregnancy: G2P1001 at 248w3d  1. Supervision of other normal pregnancy, antepartum    -Doing well, explained what to expect at [facility] (cultures, exam).  -Patient asked for meds to be sent to pharmacy, as she lost her paper medications. -Reassured her that epidural would be available for her during labor.   Preterm labor symptoms and general obstetric precautions including but not limited to vaginal bleeding, contractions, leaking of  fluid and fetal movement were reviewed in detail with the patient. Please refer to After Visit Summary for other counseling recommendations.  Return in about 2 weeks (around 08/08/2018).  No future appointments.  Marylene LandKathryn Lorraine Almas Rake, CNM

## 2018-08-12 ENCOUNTER — Ambulatory Visit (INDEPENDENT_AMBULATORY_CARE_PROVIDER_SITE_OTHER): Payer: Medicaid Other

## 2018-08-12 ENCOUNTER — Other Ambulatory Visit (HOSPITAL_COMMUNITY)
Admission: RE | Admit: 2018-08-12 | Discharge: 2018-08-12 | Disposition: A | Discharge status: Home / Self Care | Payer: Medicaid Other | Source: Ambulatory Visit

## 2018-08-12 VITALS — BP 114/67 | HR 79 | Wt 156.7 lb

## 2018-08-12 DIAGNOSIS — Z3483 Encounter for supervision of other normal pregnancy, third trimester: Secondary | ICD-10-CM

## 2018-08-12 DIAGNOSIS — N898 Other specified noninflammatory disorders of vagina: Secondary | ICD-10-CM | POA: Insufficient documentation

## 2018-08-12 DIAGNOSIS — Z3480 Encounter for supervision of other normal pregnancy, unspecified trimester: Secondary | ICD-10-CM

## 2018-08-12 NOTE — Progress Notes (Signed)
   PRENATAL VISIT NOTE  Subjective:  Allison Holmes is a 32 y.o. G2P1001 at 804w0d being seen today for ongoing prenatal care.  She is currently monitored for the following issues for this low-risk pregnancy and has Supervision of other normal pregnancy, antepartum; Language barrier affecting health care; Heartburn during pregnancy, unspecified trimester; and Constipation on their problem list.  Patient reports vaginal irritation and itching with white discharge.  Contractions: Irritability. Vag. Bleeding: None.  Movement: Present. Denies leaking of fluid.   The following portions of the patient's history were reviewed and updated as appropriate: allergies, current medications, past family history, past medical history, past social history, past surgical history and problem list. Problem list updated.  Objective:   Vitals:   08/12/18 1515  BP: 114/67  Pulse: 79  Weight: 156 lb 11.2 oz (71.1 kg)    Fetal Status: Fetal Heart Rate (bpm): 150 Fundal Height: 36 cm Movement: Present     General:  Alert, oriented and cooperative. Patient is in no acute distress.  Skin: Skin is warm and dry. No rash noted.   Cardiovascular: Normal heart rate noted  Respiratory: Normal respiratory effort, no problems with respiration noted  Abdomen: Soft, gravid, appropriate for gestational age.  Pain/Pressure: Absent     Pelvic: Cervical exam performed Dilation: Fingertip Effacement (%): Thick Station: Ballotable  Extremities: Normal range of motion.  Edema: None  Mental Status: Normal mood and affect. Normal behavior. Normal judgment and thought content.   Assessment and Plan:  Pregnancy: G2P1001 at 514w0d  1. Vaginal itching - wet prep  2. Supervision of other normal pregnancy, antepartum - Culture, beta strep (group b only) - Cervicovaginal ancillary - f/u in 2 weeks.  Preterm labor symptoms and general obstetric precautions including but not limited to vaginal bleeding, contractions, leaking of  fluid and fetal movement were reviewed in detail with the patient. Please refer to After Visit Summary for other counseling recommendations.  Return in about 2 weeks (around 08/26/2018) for Return OB visit.  Future Appointments  Date Time Provider Department Center  08/22/2018 10:15 AM Allie Bossierove, Myra C, MD [facility]    Margarita RanaHarrison D Pheobe Sandiford, Student-PA 3:36PM

## 2018-08-12 NOTE — Patient Instructions (Signed)

## 2018-08-12 NOTE — Progress Notes (Signed)
PT states is having vaginal itching with a little of white d/c.

## 2018-08-13 LAB — CERVICOVAGINAL ANCILLARY ONLY
Bacterial vaginitis: NEGATIVE
Candida vaginitis: NEGATIVE
Chlamydia: NEGATIVE
NEISSERIA GONORRHEA: NEGATIVE
Trichomonas: NEGATIVE

## 2018-08-16 LAB — CULTURE, BETA STREP (GROUP B ONLY): Strep Gp B Culture: POSITIVE — AB

## 2018-08-22 ENCOUNTER — Encounter: Payer: Medicaid Other | Admitting: Obstetrics & Gynecology

## 2018-08-29 ENCOUNTER — Ambulatory Visit (INDEPENDENT_AMBULATORY_CARE_PROVIDER_SITE_OTHER): Payer: Medicaid Other | Admitting: Obstetrics and Gynecology

## 2018-08-29 VITALS — BP 111/71 | HR 79 | Wt 153.0 lb

## 2018-08-29 DIAGNOSIS — R12 Heartburn: Secondary | ICD-10-CM

## 2018-08-29 DIAGNOSIS — Z789 Other specified health status: Secondary | ICD-10-CM

## 2018-08-29 DIAGNOSIS — Z3480 Encounter for supervision of other normal pregnancy, unspecified trimester: Secondary | ICD-10-CM

## 2018-08-29 DIAGNOSIS — O26899 Other specified pregnancy related conditions, unspecified trimester: Secondary | ICD-10-CM

## 2018-08-29 MED ORDER — TERCONAZOLE 0.4 % VA CREA: 1.0000 | TOPICAL_CREAM | Freq: Every day | VAGINAL | 0 refills | Status: DC

## 2018-08-29 MED ORDER — ONDANSETRON 4 MG PO TBDP
4.0000 mg | ORAL_TABLET | Freq: Three times a day (TID) | ORAL | 0 refills | Status: DC | PRN
Start: 2018-08-29 — End: 2021-12-04

## 2018-08-29 NOTE — Progress Notes (Signed)
Has had vomiting every morning last two weeks  and diarrhea past 3 days and insomnia, right leg pain and pelvic pressure and pack pain.

## 2018-08-29 NOTE — Progress Notes (Signed)
   PRENATAL VISIT NOTE  Subjective:  Allison Holmes is a 32 y.o. G2P1001 at 6342w3d being seen today for ongoing prenatal care.  She is currently monitored for the following issues for this low-risk pregnancy and has Supervision of other normal pregnancy, antepartum; Language barrier affecting health care; Heartburn during pregnancy, unspecified trimester; and Constipation on their problem list.  Patient reports nausea and vomiting.  Contractions: Irritability. Vag. Bleeding: None.  Movement: Present. Denies leaking of fluid.   The following portions of the patient's history were reviewed and updated as appropriate: allergies, current medications, past family history, past medical history, past social history, past surgical history and problem list. Problem list updated.  Objective:   Vitals:   08/29/18 1126  BP: 111/71  Pulse: 79  Weight: 153 lb (69.4 kg)    Fetal Status: Fetal Heart Rate (bpm): 138 Fundal Height: 37 cm Movement: Present     General:  Alert, oriented and cooperative. Patient is in no acute distress.  Skin: Skin is warm and dry. No rash noted.   Cardiovascular: Normal heart rate noted  Respiratory: Normal respiratory effort, no problems with respiration noted  Abdomen: Soft, gravid, appropriate for gestational age.  Pain/Pressure: Absent     Pelvic: Cervical exam deferred        Extremities: Normal range of motion.  Edema: None  Mental Status: Normal mood and affect. Normal behavior. Normal judgment and thought content.   Assessment and Plan:  Pregnancy: G2P1001 at 7642w3d  1. Supervision of other normal pregnancy, antepartum  - GBS positive discussed this.  2. Heartburn during pregnancy, unspecified trimester  - Avoid seasoned, spicy foods - increase Zantac to BID - Rx Zofran  3. Language barrier affecting health care  Interpretor present today.   Other orders - ondansetron (ZOFRAN ODT) 4 MG disintegrating tablet; Take 1 tablet (4 mg total) by mouth every  8 (eight) hours as needed for nausea or vomiting.  There are no diagnoses linked to this encounter. Term labor symptoms and general obstetric precautions including but not limited to vaginal bleeding, contractions, leaking of fluid and fetal movement were reviewed in detail with the patient. Please refer to After Visit Summary for other counseling recommendations.  Return in about 1 week (around 09/05/2018).  Future Appointments  Date Time Provider Department Center  09/05/2018  2:15 PM Meghana Tullo, Harolyn RutherfordJennifer I, NP [facility]  09/12/2018  2:15 PM Marvetta GibbonsBurleson, Brand Maleserri L, NP [facility]  09/19/2018  2:35 PM Arvilla MarketWallace, Catherine Lauren, DO [facility]    Venia CarbonJennifer Natsumi Whitsitt, NP

## 2018-09-05 ENCOUNTER — Ambulatory Visit (INDEPENDENT_AMBULATORY_CARE_PROVIDER_SITE_OTHER): Payer: Medicaid Other | Admitting: Obstetrics and Gynecology

## 2018-09-05 VITALS — BP 119/74 | HR 76 | Wt 155.6 lb

## 2018-09-05 DIAGNOSIS — Z3483 Encounter for supervision of other normal pregnancy, third trimester: Secondary | ICD-10-CM

## 2018-09-05 DIAGNOSIS — B951 Streptococcus, group B, as the cause of diseases classified elsewhere: Secondary | ICD-10-CM | POA: Diagnosis not present

## 2018-09-05 DIAGNOSIS — Z23 Encounter for immunization: Secondary | ICD-10-CM

## 2018-09-05 DIAGNOSIS — Z3480 Encounter for supervision of other normal pregnancy, unspecified trimester: Secondary | ICD-10-CM

## 2018-09-05 NOTE — Progress Notes (Signed)
   PRENATAL VISIT NOTE  Subjective:  Allison Holmes is a 32 y.o. G2P1001 at [redacted]w[redacted]d being seen today for ongoing prenatal care.  She is currently monitored for the following issues for this low-risk pregnancy and has Supervision of other normal pregnancy, antepartum; Language barrier affecting health care; Heartburn during pregnancy, unspecified trimester; Constipation; and Positive GBS test on their problem list.  Patient reports no complaints.  Contractions: Irregular. Vag. Bleeding: None.  Movement: Present. Denies leaking of fluid.   The following portions of the patient's history were reviewed and updated as appropriate: allergies, current medications, past family history, past medical history, past social history, past surgical history and problem list. Problem list updated.  Objective:   Vitals:   09/05/18 1427  BP: 119/74  Pulse: 76  Weight: 155 lb 9.6 oz (70.6 kg)    Fetal Status: Fetal Heart Rate (bpm): 148 Fundal Height: 37 cm Movement: Present     General:  Alert, oriented and cooperative. Patient is in no acute distress.  Skin: Skin is warm and dry. No rash noted.   Cardiovascular: Normal heart rate noted  Respiratory: Normal respiratory effort, no problems with respiration noted  Abdomen: Soft, gravid, appropriate for gestational age.  Pain/Pressure: Absent     Pelvic: Cervical exam deferred        Extremities: Normal range of motion.  Edema: None  Mental Status: Normal mood and affect. Normal behavior. Normal judgment and thought content.   Assessment and Plan:  Pregnancy: G2P1001 at [redacted]w[redacted]d  1. Supervision of other normal pregnancy, antepartum - Flu Vaccine QUAD 36+ mos IM  2. Positive GBS test  Discussed today.  Term labor symptoms and general obstetric precautions including but not limited to vaginal bleeding, contractions, leaking of fluid and fetal movement were reviewed in detail with the patient. Please refer to After Visit Summary for other counseling  recommendations.  Return in about 1 week (around 09/12/2018).  Future Appointments  Date Time Provider Department Center  09/12/2018  2:15 PM Currie Paris, NP [facility]  09/19/2018  2:35 PM Arvilla Market, DO [facility]    Venia Carbon, NP

## 2018-09-08 ENCOUNTER — Inpatient Hospital Stay (HOSPITAL_COMMUNITY): Payer: Medicaid Other | Admitting: Anesthesiology

## 2018-09-08 ENCOUNTER — Inpatient Hospital Stay (HOSPITAL_COMMUNITY)
Admission: AD | Admit: 2018-09-08 | Discharge: 2018-09-10 | DRG: 807 | Disposition: A | Discharge status: Home / Self Care | Payer: Medicaid Other | Attending: Obstetrics & Gynecology | Admitting: Obstetrics & Gynecology

## 2018-09-08 ENCOUNTER — Encounter (HOSPITAL_COMMUNITY): Payer: Self-pay

## 2018-09-08 DIAGNOSIS — O99824 Streptococcus B carrier state complicating childbirth: Principal | ICD-10-CM | POA: Diagnosis present

## 2018-09-08 DIAGNOSIS — O9962 Diseases of the digestive system complicating childbirth: Secondary | ICD-10-CM | POA: Diagnosis present

## 2018-09-08 DIAGNOSIS — Z3A38 38 weeks gestation of pregnancy: Secondary | ICD-10-CM

## 2018-09-08 DIAGNOSIS — R12 Heartburn: Secondary | ICD-10-CM | POA: Diagnosis present

## 2018-09-08 DIAGNOSIS — Z3480 Encounter for supervision of other normal pregnancy, unspecified trimester: Secondary | ICD-10-CM

## 2018-09-08 DIAGNOSIS — B951 Streptococcus, group B, as the cause of diseases classified elsewhere: Secondary | ICD-10-CM

## 2018-09-08 DIAGNOSIS — Z3483 Encounter for supervision of other normal pregnancy, third trimester: Secondary | ICD-10-CM | POA: Diagnosis present

## 2018-09-08 LAB — CBC
HEMATOCRIT: 40.5 % (ref 36.0–46.0)
HEMOGLOBIN: 13.5 g/dL (ref 12.0–15.0)
MCH: 27.7 pg (ref 26.0–34.0)
MCHC: 33.3 g/dL (ref 30.0–36.0)
MCV: 83 fL (ref 78.0–100.0)
Platelets: 212 10*3/uL (ref 150–400)
RBC: 4.88 MIL/uL (ref 3.87–5.11)
RDW: 15.2 % (ref 11.5–15.5)
WBC: 10.1 10*3/uL (ref 4.0–10.5)

## 2018-09-08 LAB — TYPE AND SCREEN
ABO/RH(D): B POS
ANTIBODY SCREEN: NEGATIVE

## 2018-09-08 MED ORDER — MEASLES, MUMPS & RUBELLA VAC ~~LOC~~ INJ
0.5000 mL | INJECTION | Freq: Once | SUBCUTANEOUS | Status: DC
  Filled 2018-09-08: qty 0.5

## 2018-09-08 MED ORDER — FAMOTIDINE 20 MG PO TABS
20.0000 mg | ORAL_TABLET | Freq: Every day | ORAL | Status: DC
  Filled 2018-09-08: qty 1

## 2018-09-08 MED ORDER — DIPHENHYDRAMINE HCL 25 MG PO CAPS: 25.0000 mg | ORAL_CAPSULE | Freq: Four times a day (QID) | ORAL | Status: DC | PRN

## 2018-09-08 MED ORDER — TETANUS-DIPHTH-ACELL PERTUSSIS 5-2.5-18.5 LF-MCG/0.5 IM SUSP: 0.5000 mL | Freq: Once | INTRAMUSCULAR | Status: DC

## 2018-09-08 MED ORDER — FLEET ENEMA 7-19 GM/118ML RE ENEM: 1.0000 | ENEMA | RECTAL | Status: DC | PRN

## 2018-09-08 MED ORDER — LACTATED RINGERS IV SOLN
500.0000 mL | Freq: Once | INTRAVENOUS | Status: AC
  Administered 2018-09-08: 500 mL via INTRAVENOUS

## 2018-09-08 MED ORDER — EPHEDRINE 5 MG/ML INJ
10.0000 mg | INTRAVENOUS | Status: DC | PRN
  Filled 2018-09-08: qty 2

## 2018-09-08 MED ORDER — OXYTOCIN 40 UNITS IN LACTATED RINGERS INFUSION - SIMPLE MED: 2.5000 [IU]/h | INTRAVENOUS | Status: DC

## 2018-09-08 MED ORDER — WITCH HAZEL-GLYCERIN EX PADS: 1.0000 "application " | MEDICATED_PAD | CUTANEOUS | Status: DC | PRN

## 2018-09-08 MED ORDER — PRENATAL MULTIVITAMIN CH
1.0000 | ORAL_TABLET | Freq: Every day | ORAL | Status: DC
  Administered 2018-09-09 – 2018-09-10 (×2): 1 via ORAL
  Filled 2018-09-08 (×2): qty 1

## 2018-09-08 MED ORDER — OXYCODONE-ACETAMINOPHEN 5-325 MG PO TABS: 1.0000 | ORAL_TABLET | ORAL | Status: DC | PRN

## 2018-09-08 MED ORDER — ACETAMINOPHEN 325 MG PO TABS: 650.0000 mg | ORAL_TABLET | ORAL | Status: DC | PRN

## 2018-09-08 MED ORDER — PENICILLIN G 3 MILLION UNITS IVPB - SIMPLE MED
3.0000 10*6.[IU] | INTRAVENOUS | Status: DC
  Administered 2018-09-08: 3 10*6.[IU] via INTRAVENOUS
  Filled 2018-09-08: qty 100
  Filled 2018-09-08: qty 3
  Filled 2018-09-08: qty 100

## 2018-09-08 MED ORDER — ZOLPIDEM TARTRATE 5 MG PO TABS: 5.0000 mg | ORAL_TABLET | Freq: Every evening | ORAL | Status: DC | PRN

## 2018-09-08 MED ORDER — FENTANYL 2.5 MCG/ML BUPIVACAINE 1/10 % EPIDURAL INFUSION (WH - ANES)
14.0000 mL/h | INTRAMUSCULAR | Status: DC | PRN
  Administered 2018-09-08: 14 mL/h via EPIDURAL
  Filled 2018-09-08: qty 100

## 2018-09-08 MED ORDER — ONDANSETRON HCL 4 MG PO TABS: 4.0000 mg | ORAL_TABLET | ORAL | Status: DC | PRN

## 2018-09-08 MED ORDER — OXYCODONE-ACETAMINOPHEN 5-325 MG PO TABS: 2.0000 | ORAL_TABLET | ORAL | Status: DC | PRN

## 2018-09-08 MED ORDER — PHENYLEPHRINE 40 MCG/ML (10ML) SYRINGE FOR IV PUSH (FOR BLOOD PRESSURE SUPPORT)
80.0000 ug | PREFILLED_SYRINGE | INTRAVENOUS | Status: DC | PRN
  Filled 2018-09-08: qty 10
  Filled 2018-09-08: qty 5

## 2018-09-08 MED ORDER — SODIUM CHLORIDE 0.9 % IV SOLN
5.0000 10*6.[IU] | Freq: Once | INTRAVENOUS | Status: AC
  Administered 2018-09-08: 5 10*6.[IU] via INTRAVENOUS
  Filled 2018-09-08: qty 5

## 2018-09-08 MED ORDER — IBUPROFEN 600 MG PO TABS
600.0000 mg | ORAL_TABLET | Freq: Four times a day (QID) | ORAL | Status: DC
  Administered 2018-09-08 – 2018-09-10 (×7): 600 mg via ORAL
  Filled 2018-09-08 (×7): qty 1

## 2018-09-08 MED ORDER — OXYTOCIN BOLUS FROM INFUSION
500.0000 mL | Freq: Once | INTRAVENOUS | Status: AC
  Administered 2018-09-08: 500 mL via INTRAVENOUS

## 2018-09-08 MED ORDER — LACTATED RINGERS IV SOLN
INTRAVENOUS | Status: DC
  Administered 2018-09-08 (×2): via INTRAVENOUS

## 2018-09-08 MED ORDER — ACETAMINOPHEN 325 MG PO TABS
650.0000 mg | ORAL_TABLET | ORAL | Status: DC | PRN
  Administered 2018-09-09 (×2): 650 mg via ORAL
  Filled 2018-09-08 (×2): qty 2

## 2018-09-08 MED ORDER — ONDANSETRON HCL 4 MG/2ML IJ SOLN
4.0000 mg | Freq: Four times a day (QID) | INTRAMUSCULAR | Status: DC | PRN
  Administered 2018-09-08: 4 mg via INTRAVENOUS
  Filled 2018-09-08: qty 2

## 2018-09-08 MED ORDER — SOD CITRATE-CITRIC ACID 500-334 MG/5ML PO SOLN: 30.0000 mL | ORAL | Status: DC | PRN

## 2018-09-08 MED ORDER — OXYTOCIN 40 UNITS IN LACTATED RINGERS INFUSION - SIMPLE MED
1.0000 m[IU]/min | INTRAVENOUS | Status: DC
  Administered 2018-09-08: 2 m[IU]/min via INTRAVENOUS
  Filled 2018-09-08: qty 1000

## 2018-09-08 MED ORDER — LACTATED RINGERS IV SOLN: 500.0000 mL | INTRAVENOUS | Status: DC | PRN

## 2018-09-08 MED ORDER — LIDOCAINE HCL (PF) 1 % IJ SOLN
INTRAMUSCULAR | Status: DC | PRN
  Administered 2018-09-08 (×2): 5 mL via EPIDURAL

## 2018-09-08 MED ORDER — TERBUTALINE SULFATE 1 MG/ML IJ SOLN
0.2500 mg | Freq: Once | INTRAMUSCULAR | Status: DC | PRN
  Filled 2018-09-08: qty 1

## 2018-09-08 MED ORDER — PHENYLEPHRINE 40 MCG/ML (10ML) SYRINGE FOR IV PUSH (FOR BLOOD PRESSURE SUPPORT)
80.0000 ug | PREFILLED_SYRINGE | INTRAVENOUS | Status: DC | PRN
  Filled 2018-09-08: qty 5

## 2018-09-08 MED ORDER — SENNOSIDES-DOCUSATE SODIUM 8.6-50 MG PO TABS
2.0000 | ORAL_TABLET | ORAL | Status: DC
  Administered 2018-09-08 – 2018-09-09 (×2): 2 via ORAL
  Filled 2018-09-08 (×2): qty 2

## 2018-09-08 MED ORDER — BENZOCAINE-MENTHOL 20-0.5 % EX AERO
1.0000 "application " | INHALATION_SPRAY | CUTANEOUS | Status: DC | PRN
  Administered 2018-09-09: 1 via TOPICAL
  Filled 2018-09-08: qty 56

## 2018-09-08 MED ORDER — SIMETHICONE 80 MG PO CHEW: 80.0000 mg | CHEWABLE_TABLET | ORAL | Status: DC | PRN

## 2018-09-08 MED ORDER — ONDANSETRON HCL 4 MG/2ML IJ SOLN: 4.0000 mg | INTRAMUSCULAR | Status: DC | PRN

## 2018-09-08 MED ORDER — COCONUT OIL OIL
1.0000 "application " | TOPICAL_OIL | Status: DC | PRN
  Administered 2018-09-10: 1 via TOPICAL
  Filled 2018-09-08: qty 120

## 2018-09-08 MED ORDER — DIPHENHYDRAMINE HCL 50 MG/ML IJ SOLN: 12.5000 mg | INTRAMUSCULAR | Status: DC | PRN

## 2018-09-08 MED ORDER — LIDOCAINE HCL (PF) 1 % IJ SOLN
30.0000 mL | INTRAMUSCULAR | Status: DC | PRN
  Filled 2018-09-08: qty 30

## 2018-09-08 MED ORDER — DIBUCAINE 1 % RE OINT: 1.0000 "application " | TOPICAL_OINTMENT | RECTAL | Status: DC | PRN

## 2018-09-08 NOTE — Anesthesia Procedure Notes (Signed)
Epidural Patient location during procedure: OB  Staffing Anesthesiologist: Honora Searson, MD Performed: anesthesiologist   Preanesthetic Checklist Completed: patient identified, site marked, surgical consent, pre-op evaluation, timeout performed, IV checked, risks and benefits discussed and monitors and equipment checked  Epidural Patient position: sitting Prep: DuraPrep Patient monitoring: heart rate, continuous pulse ox and blood pressure Approach: right paramedian Location: L3-L4 Injection technique: LOR saline  Needle:  Needle type: Tuohy  Needle gauge: 17 G Needle length: 9 cm and 9 Needle insertion depth: 5 cm Catheter type: closed end flexible Catheter size: 20 Guage Catheter at skin depth: 9 cm Test dose: negative  Assessment Events: blood not aspirated, injection not painful, no injection resistance, negative IV test and no paresthesia  Additional Notes Patient identified. Risks/Benefits/Options discussed with patient including but not limited to bleeding, infection, nerve damage, paralysis, failed block, incomplete pain control, headache, blood pressure changes, nausea, vomiting, reactions to medication both or allergic, itching and postpartum back pain. Confirmed with bedside nurse the patient's most recent platelet count. Confirmed with patient that they are not currently taking any anticoagulation, have any bleeding history or any family history of bleeding disorders. Patient expressed understanding and wished to proceed. All questions were answered. Sterile technique was used throughout the entire procedure. Please see nursing notes for vital signs. Test dose was given through epidural needle and negative prior to continuing to dose epidural or start infusion. Warning signs of high block given to the patient including shortness of breath, tingling/numbness in hands, complete motor block, or any concerning symptoms with instructions to call for help. Patient was given  instructions on fall risk and not to get out of bed. All questions and concerns addressed with instructions to call with any issues.     

## 2018-09-08 NOTE — Progress Notes (Signed)
Using [facility] interpreter [phone]

## 2018-09-08 NOTE — Progress Notes (Signed)
Using [facility] interpreter [phone]

## 2018-09-08 NOTE — MAU Note (Signed)
Pt reports contractions, some spotting, denies ROM

## 2018-09-08 NOTE — Progress Notes (Signed)
Interpreter [phone], Allison Holmes used during admission for mother and baby. Patient understands to call for assist to bathroom at least once and until gait is steady. She understands and verbalizes limited english and answers some questions without interpreter. Patient wishes to breast/bottle feed halal formula with nipple. Dinner ordered, patient does not wish to order breakfast at this time. She verbalizes understanding and no further questions.

## 2018-09-08 NOTE — Anesthesia Preprocedure Evaluation (Signed)

## 2018-09-08 NOTE — H&P (Addendum)
OBSTETRIC ADMISSION HISTORY AND PHYSICAL  Allison Holmes is a 32 y.o. female G2P1001 with IUP at [redacted]w[redacted]d by LMP presenting for early labor w/ ctx and some vaginal spotting since 0600 today. She reports +FMs, No LOF, no blurry vision, headaches or peripheral edema, and RUQ pain.  Pain well controlled on epidural infusion. She plans on breast and bottle feeding. She requests Nexplanon for birth control. She received her prenatal care at [facility]   Dating: By LMP --->  Estimated Date of Delivery: 09/16/18  Sono:    @[redacted]w[redacted]d , CWD, normal anatomy, cephalic presentation, 311g, 56% EFW   Prenatal History/Complications:  Past Medical History: History reviewed. No pertinent past medical history.  Past Surgical History: History reviewed. No pertinent surgical history.  Obstetrical History: OB History    Gravida  2   Para  1   Term  1   Preterm  0   AB  0   Living  1     SAB  0   TAB  0   Ectopic  0   Multiple  0   Live Births  1           Social History: Social History   Socioeconomic History  . Marital status: Married    Spouse name: Not on file  . Number of children: Not on file  . Years of education: Not on file  . Highest education level: Not on file  Occupational History  . Not on file  Social Needs  . Financial resource strain: Not on file  . Food insecurity:    Worry: Not on file    Inability: Not on file  . Transportation needs:    Medical: Not on file    Non-medical: Not on file  Tobacco Use  . Smoking status: Never Smoker  . Smokeless tobacco: Never Used  Substance and Sexual Activity  . Alcohol use: No  . Drug use: Never  . Sexual activity: Yes    Birth control/protection: None  Lifestyle  . Physical activity:    Days per week: Not on file    Minutes per session: Not on file  . Stress: Not on file  Relationships  . Social connections:    Talks on phone: Not on file    Gets together: Not on file    Attends religious service: Not on file   Active member of club or organization: Not on file    Attends meetings of clubs or organizations: Not on file    Relationship status: Not on file  Other Topics Concern  . Not on file  Social History Narrative  . Not on file    Family History: No family history on file.  Allergies: No Known Allergies  Medications Prior to Admission  Medication Sig Dispense Refill Last Dose  . cyclobenzaprine (FLEXERIL) 10 MG tablet Take 1 tablet (10 mg total) by mouth 2 (two) times daily as needed for muscle spasms. (Patient not taking: Reported on 08/12/2018) 20 tablet 0 Not Taking  . docusate calcium (SURFAK) 240 MG capsule Take 1 capsule (240 mg total) by mouth daily. (Patient not taking: Reported on 08/12/2018) 30 capsule 2 Not Taking  . ondansetron (ZOFRAN ODT) 4 MG disintegrating tablet Take 1 tablet (4 mg total) by mouth every 8 (eight) hours as needed for nausea or vomiting. 20 tablet 0 Taking  . Prenatal Multivit-Min-Fe-FA (PRENATAL VITAMINS) 0.8 MG tablet Take 1 tablet by mouth daily. 30 tablet 12 Taking  . Prenatal Vit-Fe Fumarate-FA (MULTIVITAMIN-PRENATAL) 27-0.8 MG  TABS tablet Take 1 tablet by mouth daily at 12 noon. 30 each 11 Taking  . ranitidine (ZANTAC) 150 MG tablet Take 1 tablet (150 mg total) by mouth 2 (two) times daily. 60 tablet 11 Taking  . terconazole (TERAZOL 7) 0.4 % vaginal cream Place 1 applicator vaginally at bedtime. 45 g 0 Taking     Review of Systems   All systems reviewed and negative except as stated in HPI  Blood pressure 115/75, pulse 78, temperature 98.1 F (36.7 C), temperature source Oral, resp. rate 17, height 5' 7.5" (1.715 m), weight 70.8 kg, last menstrual period 12/10/2017, SpO2 100 %, unknown if currently breastfeeding. General appearance: alert and cooperative Lungs: clear to auscultation bilaterally Heart: regular rate and rhythm Abdomen: soft, non-tender; bowel sounds normal Pelvic: 6/90/-2 Extremities: Homans sign is negative, no sign of DVT DTR's:  2+ in LEs bilaterally  Presentation: cephalic Fetal monitoringBaseline: 140 bpm, Variability: Good {> 6 bpm), Accelerations: Reactive and Decelerations: Absent Uterine activity: irregular contractions Dilation: 6 Effacement (%): 90 Station: -2, -3 Exam by:: Allison Curia, RN   Prenatal labs: ABO, Rh: B/Positive/-- (03/22 1336) Antibody: Negative (03/22 1336) Rubella: 27.30 (03/22 1336) RPR: Non Reactive (06/26 0950)  HBsAg: Negative (03/22 1336)  HIV: Non Reactive (06/26 0950)  GBS:   Positive 1 hr Glucola: 97 (06/26 0950) Genetic screening: Normal Anatomy US: Normal (04/23 1157)   Prenatal Transfer Tool  Maternal Diabetes: No Genetic Screening: Normal Maternal Ultrasounds/Referrals: Normal Fetal Ultrasounds or other Referrals:  None Maternal Substance Abuse:  No Significant Maternal Medications:  None Significant Maternal Lab Results: Lab values include: Group B Strep positive  No results found for this or any previous visit (from the past 24 hour(s)).  Patient Active Problem List   Diagnosis Date Noted  . Vaginal delivery 09/08/2018  . Positive GBS test 09/05/2018  . Heartburn during pregnancy, unspecified trimester 07/10/2018  . Constipation 07/10/2018  . Language barrier affecting health care 03/22/2018  . Supervision of other normal pregnancy, antepartum 03/21/2018    Assessment/Plan:  Allison Holmes is a 32 y.o. G2P1001 at 104w6d here for early labor w/ ctx and some vaginal spotting.   #Labor: Progression slowed, with no significant change since arrival. Start Pitocin IV and monitor.  #Pain: Epidural placed.  #FWB: Cat 1 #ID:  GBS positive; administer PCN IV #MOF:  Breast and bottle #MOC: Nexplanon #Circ:  Deferred  Lorelei Pont, Student-PA  09/08/2018, 11:48 AM   OB FELLOW HISTORY AND PHYSICAL ATTESTATION  I have seen and examined this patient; I agree with above documentation in the PA student's note.   Allison Holmes is a 32 y.o. G2P1001 female at [redacted]w[redacted]d  who presented for SOL. Ctx started at 6 am this morning. Reports some bloody show but no significant leakage of fluid. Pregnancy course has been uncomplicated.   Dilation: 6 Effacement (%): 90 Cervical Position: Middle Station: -2, -3 Presentation: Vertex Exam by:: s grindstaff rn   Cat I FHT: 125 bpm, mod variability, +acels, no decls  Toco: Irregular ctx with irritability   Patient is GBS+, receiving PCN for prophylaxis. Epidural is in place. Having baby boy, declines circ. Planning to breast and bottle feed. Requests Nexplanon for contraception.   Ctx pattern has diminished and patient has not has not made any cervical change since MAU check 4+ hours ago. Will start Pitocin for labor augmentation.   Marcy Siren, D.O. OB Fellow  09/08/2018, 3:56 PM

## 2018-09-09 LAB — RPR: RPR: NONREACTIVE

## 2018-09-09 NOTE — Progress Notes (Signed)
Post Partum Day 1 Subjective: Doing well. Having some cramping but bleeding is mild. Getting up and moving around without difficulty.  Objective: Blood pressure 106/79, pulse 83, temperature 98.4 F (36.9 C), temperature source Oral, resp. rate 17, height 5' 7.5" (1.715 m), weight 70.8 kg, last menstrual period 12/10/2017, SpO2 98 %, unknown if currently breastfeeding.  Physical Exam:  General: alert and NAD Lochia: appropriate Uterine Fundus: firm Incision: N/A DVT Evaluation: No significant calf/ankle edema.  Recent Labs    09/08/18 1150  HGB 13.5  HCT 40.5    Assessment/Plan: Plan for discharge tomorrow  Continue routine postpartum care Breastfeeding support    LOS: 1 day   Deshundra Waller S Jaythan Hinely 09/09/2018, 5:12 PM

## 2018-09-09 NOTE — Anesthesia Postprocedure Evaluation (Signed)
Anesthesia Post Note  Patient: Allison Holmes  Procedure(s) Performed: AN AD HOC LABOR EPIDURAL     Patient location during evaluation: Mother Baby Anesthesia Type: Epidural Level of consciousness: awake, awake and alert and oriented Vital Signs Assessment: post-procedure vital signs reviewed and stable Respiratory status: spontaneous breathing, nonlabored ventilation and respiratory function stable Cardiovascular status: stable Postop Assessment: no backache, able to ambulate, no headache, patient able to bend at knees, no apparent nausea or vomiting and adequate PO intake Anesthetic complications: no    Last Vitals:  Vitals:   09/09/18 0204 09/09/18 0546  BP: 99/64 103/73  Pulse: 69 67  Resp: 18 18  Temp: 36.8 C 36.6 C  SpO2:      Last Pain:  Vitals:   09/09/18 0816  TempSrc:   PainSc: 0-No pain   Pain Goal:                 Desirea Mizrahi

## 2018-09-09 NOTE — Lactation Note (Signed)
This note was copied from a baby's chart. Lactation Consultation Note  Patient Name: Allison Holmes [id number] Date: 09/09/2018 Reason for consult: Initial assessment;Early term 37-38.6wks P1, 11 hour female infant, ETI Interpreter used Mohammed # [phone] Per mom, BF eldest son 1 year 9 months Per mom had a lot breast trauma sore bloody nipples in beginning. Mom feels she doesn't have milk for baby. LC asked mom hand express and colostrum present in both breast mom was well pleased. Mom latch infant in scissor hand position and LC notice she took him off breast to change wet diaper she did not break infant's  latch from breast. LC had interpreter explain importance of breaking latch to prevent nipple trauma. Mom re-latched infant to breast after LC changed infant's diaper using cross cradle position and with c-hand hold instead of scissor hold. Infant latched with wide gape and swallows observed by LC. Mom was pleased with breastfeeding infant.  Mom knows to feed according hunger cues, 8 to 12 times within 24 hours including nights, not to exceed 3 hours without feeding infant. LC discussed I&O Mom plans to breastfeed infant first for 20 minutes then supplement with formula 5 -10 ml based on age / hours with slow flow bottle nipple.  Reviewed Baby & Me book's Breastfeeding Basics. Mom made aware of O/P services, breastfeeding support groups, community resources, and our phone # for post-discharge questions.   Maternal Data Formula Feeding for Exclusion: No Has patient been taught Hand Expression?: Yes(Hand expression taught by [facility]) Does the patient have breastfeeding experience prior to this delivery?: Yes  Feeding Feeding Type: Bottle Fed - Formula Nipple Type: Regular Length of feed: 9 min  LATCH Score Latch: Grasps breast easily, tongue down, lips flanged, rhythmical sucking.  Audible Swallowing: Spontaneous and intermittent  Type of Nipple: Everted at rest and after  stimulation  Comfort (Breast/Nipple): Soft / non-tender  Hold (Positioning): Assistance needed to correctly position infant at breast and maintain latch.  LATCH Score: 9  Interventions Interventions: Breast feeding basics reviewed;Support pillows;Assisted with latch;Position options;Breast massage;Breast compression  Lactation Tools Discussed/Used     Consult Status Consult Status: Follow-up Date: 09/09/18 Follow-up type: In-patient    Danelle Earthly 09/09/2018, 5:28 AM

## 2018-09-10 MED ORDER — ACETAMINOPHEN 325 MG PO TABS: 650.0000 mg | ORAL_TABLET | ORAL | 0 refills | Status: DC | PRN

## 2018-09-10 MED ORDER — IBUPROFEN 600 MG PO TABS: 600.0000 mg | ORAL_TABLET | Freq: Four times a day (QID) | ORAL | 0 refills | Status: DC

## 2018-09-10 NOTE — Lactation Note (Signed)
This note was copied from a baby's chart. Lactation Consultation Note  Patient Name: Allison Holmes [id number] Date: 09/10/2018 Reason for consult: Follow-up assessment Video interpreter used for visit.  Mom continues to both breast and formula feed per choice.  She states baby is latching without difficulty but nipples are a little tender.  RN will provide coconut oil.  Stressed importance of putting baby to breast first prior to formula.  Discussed milk coming to volume.  Mom denies questions.  Lactation outpatient services and support reviewed and encouraged prn.  Maternal Data    Feeding Feeding Type: Bottle Fed - Formula  LATCH Score                   Interventions    Lactation Tools Discussed/Used     Consult Status Consult Status: Complete Follow-up type: Call as needed    Huston Foley 09/10/2018, 9:41 AM

## 2018-09-10 NOTE — Discharge Instructions (Signed)
Vaginal Delivery, Care After °Refer to this sheet in the next few weeks. These instructions provide you with information about caring for yourself after vaginal delivery. Your health care provider may also give you more specific instructions. Your treatment has been planned according to current medical practices, but problems sometimes occur. Call your health care provider if you have any problems or questions. °What can I expect after the procedure? °After vaginal delivery, it is common to have: °· Some bleeding from your vagina. °· Soreness in your abdomen, your vagina, and the area of skin between your vaginal opening and your anus (perineum). °· Pelvic cramps. °· Fatigue. ° °Follow these instructions at home: °Medicines °· Take over-the-counter and prescription medicines only as told by your health care provider. °· If you were prescribed an antibiotic medicine, take it as told by your health care provider. Do not stop taking the antibiotic until it is finished. °Driving ° °· Do not drive or operate heavy machinery while taking prescription pain medicine. °· Do not drive for 24 hours if you received a sedative. °Lifestyle °· Do not drink alcohol. This is especially important if you are breastfeeding or taking medicine to relieve pain. °· Do not use tobacco products, including cigarettes, chewing tobacco, or e-cigarettes. If you need help quitting, ask your health care provider. °Eating and drinking °· Drink at least 8 eight-ounce glasses of water every day unless you are told not to by your health care provider. If you choose to breastfeed your baby, you may need to drink more water than this. °· Eat high-fiber foods every day. These foods may help prevent or relieve constipation. High-fiber foods include: °? Whole grain cereals and breads. °? Brown rice. °? Beans. °? Fresh fruits and vegetables. °Activity °· Return to your normal activities as told by your health care provider. Ask your health care provider  what activities are safe for you. °· Rest as much as possible. Try to rest or take a nap when your baby is sleeping. °· Do not lift anything that is heavier than your baby or 10 lb (4.5 kg) until your health care provider says that it is safe. °· Talk with your health care provider about when you can engage in sexual activity. This may depend on your: °? Risk of infection. °? Rate of healing. °? Comfort and desire to engage in sexual activity. °Vaginal Care °· If you have an episiotomy or a vaginal tear, check the area every day for signs of infection. Check for: °? More redness, swelling, or pain. °? More fluid or blood. °? Warmth. °? Pus or a bad smell. °· Do not use tampons or douches until your health care provider says this is safe. °· Watch for any blood clots that may pass from your vagina. These may look like clumps of dark red, brown, or black discharge. °General instructions °· Keep your perineum clean and dry as told by your health care provider. °· Wear loose, comfortable clothing. °· Wipe from front to back when you use the toilet. °· Ask your health care provider if you can shower or take a bath. If you had an episiotomy or a perineal tear during labor and delivery, your health care provider may tell you not to take baths for a certain length of time. °· Wear a bra that supports your breasts and fits you well. °· If possible, have someone help you with household activities and help care for your baby for at least a few days after   you leave the hospital. °· Keep all follow-up visits for you and your baby as told by your health care provider. This is important. °Contact a health care provider if: °· You have: °? Vaginal discharge that has a bad smell. °? Difficulty urinating. °? Pain when urinating. °? A sudden increase or decrease in the frequency of your bowel movements. °? More redness, swelling, or pain around your episiotomy or vaginal tear. °? More fluid or blood coming from your episiotomy or  vaginal tear. °? Pus or a bad smell coming from your episiotomy or vaginal tear. °? A fever. °? A rash. °? Little or no interest in activities you used to enjoy. °? Questions about caring for yourself or your baby. °· Your episiotomy or vaginal tear feels warm to the touch. °· Your episiotomy or vaginal tear is separating or does not appear to be healing. °· Your breasts are painful, hard, or turn red. °· You feel unusually sad or worried. °· You feel nauseous or you vomit. °· You pass large blood clots from your vagina. If you pass a blood clot from your vagina, save it to show to your health care provider. Do not flush blood clots down the toilet without having your health care provider look at them. °· You urinate more than usual. °· You are dizzy or light-headed. °· You have not breastfed at all and you have not had a menstrual period for 12 weeks after delivery. °· You have stopped breastfeeding and you have not had a menstrual period for 12 weeks after you stopped breastfeeding. °Get help right away if: °· You have: °? Pain that does not go away or does not get better with medicine. °? Chest pain. °? Difficulty breathing. °? Blurred vision or spots in your vision. °? Thoughts about hurting yourself or your baby. °· You develop pain in your abdomen or in one of your legs. °· You develop a severe headache. °· You faint. °· You bleed from your vagina so much that you fill two sanitary pads in one hour. °This information is not intended to replace advice given to you by your health care provider. Make sure you discuss any questions you have with your health care provider. °Document Released: 12/14/2000 Document Revised: 05/30/2016 Document Reviewed: 01/01/2016 °Elsevier Interactive Patient Education © 2018 [employer] Inc. ° °

## 2018-09-10 NOTE — Discharge Summary (Addendum)
Obstetrics Discharge Summary [facility]N [facility]   Patient Name: Allison Holmes DOB: Jun 18, 1986 MRN: [id number]  Date of admission: 09/08/2018 Delivering MD: Arvilla Market   Date of discharge: 09/10/2018  Admitting diagnosis: 39wks ctx  Intrauterine pregnancy: [redacted]w[redacted]d     Secondary diagnosis:   Active Problems:   Vaginal delivery   Additional problems:  . none     Discharge diagnosis: Term Pregnancy Delivered                                            Postpartum procedures: None  Complications: none  Hospital course: Allison Holmes is a 32 y.o. [redacted]w[redacted]d who was admitted for SOL. Her pregnancy was uncomplicated. Her labor course was unremarkable. Delivery was uncomplicated. Please see delivery/op note for additional details. Her postpartum course was uncomplicated. She was breastfeeding without difficulty. By day of discharge, she was passing flatus, urinating, eating and drinking without difficulty. Her pain was well-controlled, and she was discharged home with tylenol/motrin. She will follow-up in clinic in 4 weeks.   Physical exam  Vitals:   09/09/18 0546 09/09/18 1505 09/09/18 2155 09/10/18 0529  BP: 103/73 106/79 111/68 115/75  Pulse: 67 83 74 70  Resp: 18 17 18 16   Temp: 97.9 F (36.6 C) 98.4 F (36.9 C) 98.5 F (36.9 C) 98 F (36.7 C)  TempSrc: Oral Oral Oral Oral  SpO2:  98%  99%  Weight:      Height:       General: AAOx3, NAD. Lochia: appropriate Uterine Fundus: firm Incision: N/A DVT Evaluation: No evidence of DVT seen on physical exam. Labs: Lab Results  Component Value Date   WBC 10.1 09/08/2018   HGB 13.5 09/08/2018   HCT 40.5 09/08/2018   MCV 83.0 09/08/2018   PLT 212 09/08/2018   No flowsheet data found.  Discharge instructions: Per After Visit Summary and "Baby and Me Booklet"  After visit meds:  Allergies as of 09/10/2018   No Known Allergies     Medication List    TAKE these medications   acetaminophen 325 MG tablet Commonly  known as:  TYLENOL Take 2 tablets (650 mg total) by mouth every 4 (four) hours as needed (for pain scale < 4).   cyclobenzaprine 10 MG tablet Commonly known as:  FLEXERIL Take 1 tablet (10 mg total) by mouth 2 (two) times daily as needed for muscle spasms.   docusate calcium 240 MG capsule Commonly known as:  SURFAK Take 1 capsule (240 mg total) by mouth daily.   ibuprofen 600 MG tablet Commonly known as:  ADVIL,MOTRIN Take 1 tablet (600 mg total) by mouth every 6 (six) hours.   multivitamin-prenatal 27-0.8 MG Tabs tablet Take 1 tablet by mouth daily at 12 noon.   ondansetron 4 MG disintegrating tablet Commonly known as:  ZOFRAN-ODT Take 1 tablet (4 mg total) by mouth every 8 (eight) hours as needed for nausea or vomiting.   ranitidine 150 MG tablet Commonly known as:  ZANTAC Take 1 tablet (150 mg total) by mouth 2 (two) times daily.   terconazole 0.4 % vaginal cream Commonly known as:  TERAZOL 7 Place 1 applicator vaginally at bedtime.       Postpartum contraception: Nexplanon Diet: Routine Diet Activity: Advance as tolerated. Pelvic rest for 6 weeks.   Outpatient follow up:4wks Follow-up Appt:No future appointments. Follow-up Visit:No follow-ups on file.  Newborn Data:  Live born female  Birth Weight: 8 lb 2.9 oz (3710 g) APGAR: 9, 9  Newborn Delivery   Birth date/time:  09/08/2018 17:40:00 Delivery type:  Vaginal, Spontaneous     Baby Feeding: both Disposition:home with mother   I confirm that I have verified the information documented in the resident's note and that I have also personally reperformed the physical exam and all medical decision making activities.  Rolm Bookbinder, CNM  09/10/18 10:42 AM

## 2018-09-12 ENCOUNTER — Encounter: Payer: Medicaid Other | Admitting: Nurse Practitioner

## 2018-09-19 ENCOUNTER — Encounter: Payer: Medicaid Other | Admitting: Internal Medicine

## 2018-10-14 ENCOUNTER — Encounter: Payer: Self-pay | Admitting: *Deleted

## 2018-10-14 ENCOUNTER — Encounter: Payer: Self-pay | Admitting: Family Medicine

## 2018-10-14 ENCOUNTER — Ambulatory Visit (INDEPENDENT_AMBULATORY_CARE_PROVIDER_SITE_OTHER): Payer: Medicaid Other | Admitting: Advanced Practice Midwife

## 2018-10-14 ENCOUNTER — Encounter: Payer: Self-pay | Admitting: Advanced Practice Midwife

## 2018-10-14 ENCOUNTER — Ambulatory Visit: Payer: Medicaid Other | Admitting: Obstetrics and Gynecology

## 2018-10-14 VITALS — BP 111/71 | HR 76 | Ht 65.0 in | Wt 137.7 lb

## 2018-10-14 DIAGNOSIS — Z789 Other specified health status: Secondary | ICD-10-CM | POA: Diagnosis not present

## 2018-10-14 DIAGNOSIS — Z3202 Encounter for pregnancy test, result negative: Secondary | ICD-10-CM | POA: Diagnosis not present

## 2018-10-14 DIAGNOSIS — Z1389 Encounter for screening for other disorder: Secondary | ICD-10-CM | POA: Diagnosis not present

## 2018-10-14 DIAGNOSIS — Z30430 Encounter for insertion of intrauterine contraceptive device: Secondary | ICD-10-CM | POA: Diagnosis not present

## 2018-10-14 LAB — POCT PREGNANCY, URINE: PREG TEST UR: NEGATIVE

## 2018-10-14 MED ORDER — LEVONORGESTREL 19.5 MCG/DAY IU IUD
INTRAUTERINE_SYSTEM | Freq: Once | INTRAUTERINE | Status: AC
  Administered 2018-10-14: 19:00:00 via INTRAUTERINE

## 2018-10-14 NOTE — Progress Notes (Signed)
Pt states wants IUD.

## 2018-10-14 NOTE — Patient Instructions (Addendum)
For nipple pain with breastfeeding:  Polysporin (bacitracin, Polymyxin B) Cortisone cream (Hydrocortisone)                           .                   .          .    -              .             (     ).              .                      .                         .         :             .            (  ).       .   .           .    .             .        .         .        .                              ()   .             .         .          ().            ().       2-3        .             :          .    3-4 .       .             Marland Kitchen  .         Rolm Gala    .     3-4    .        Marland Kitchen       .          .      .         .              .     .                      [place] Cornea altabieiat altahadiyat walhulul rghm 'ana alradaeat altabieiat 'amr tabieiun , 'illa 'anaha qad takun saebatan , khasatan fi al'asabie alqalilat al'uwlaa baed alwiladati. min altabieii 'an tansha mashakil eind albad' fi 'iirdae tiflik aljadid , hataa law kunt qad 'ardaeat min qibl. taqadam hadhih alwathiqat bed alhulul litahadiyat alradaeat altabieiat al'akthar shyweana. altahadiyat walhulul altahadiy - halamat mutashaqiqat 'aw hisasa eadatan ma tueani  alhalamat almutashaqiqat 'aw almulimat min al'umhat almurdaeati. ghalbana ma takun alhalamat almutashaqiqat 'aw almulimat natijatan ean al'iighlaq ghyr alkafi (endama yatasil fam tiflik bialradaeat altbyey). ymkn 'an yahduth wajae 'aydaan 'iidha lm yatima wade taflak bishakl sahih fi sadruk. ela alrghm mn 'an tashaqaq alhalmat wajeaha 'amr shayie khilal al'usbue al'awal baed alwiladat , 'iilaa 'an 'alama alhilmat lays tbyeyana abdana. 'iidha kunt tueani min tushaquqat  fi alhilmat 'aw wajae yastamiru li'akthar min 'usbue 'aw 'alam fi alhilmat , faitasal bimaqdam alrieayat alsihiyat 'aw aistishari alradaeat. almahlul ahrisi ealaa tathbit tiflak wawadeih biaitibae alkhutuat altaaliyat: 'abhath ean makan murih liljulus 'aw alaistilqa' , mae daem alraqbat walzahr jydana. dae wasadat 'aw lafha bitanit 'asfal tiflak li'iihdarih ealaa [place] althidii ('iidha [place]). ta'akad min 'an batan tiflak yuajih bitanik. altadaliyk bilatf sadrik. bi'atraf 'asabaeak , dalki min jadar sadruk nahw halmatik biharakat dayiriatin. hdha yushajie tadafuq alhalib. qad tahtaj 'iilaa muasalat hdha al'iijra' 'athna' alradaeat 'iidha kan halibuk yatadafaq bibt'. 'udaeim sadruk bi'arbaeat 'asabie tahtah wa'iibhamik fawq hilmtik. ta'akad min 'an 'usabieak baeidat ean hilmatik wafam taflak. alsaktat aldamaghiat shifati tiflak bilatf bi'iisbieik 'aw alhalmati. eindama yakun fam tiflak mftwhana ealaa nitaq wasie bma fyh alkfayt , aihdura tiflak bsret 'iilaa thadyik , wawade alhilmat balkaml wa'akbar qadr mumkin min almintaqat almulawanat hawl alhilma (alhalata) fi fam taflak. yjb 'an takun alhalat maryiyat 'aelaa alshafat aleilawiat litiflak 'akthar min alshafat alsafaliati. yjb 'an yakun lisan tiflak bayn alluthat alsifliat walthadi. ta'akad min wade fam taflak bishakl sahih hawl halamatk (mghalqa). yjb 'an takhluq shafah tiflik khtmana ealaa sadruk wa'an ytma 'iitfawuh (mdrwbana). min alshaayie 'an yamtas tiflak limudat 2-3 daqayiq min ajl bad' tadafuq halib al'um. tashmal aldalayil alty tushir 'iilaa najah tiflak fi hulmat althidii ma yly: aljuru bihudu' 'aw misin bihudu' dun 'an yusabib lak al'alm. samiet albale bayn 3-4 tamtas. harakat aleadalat 'aelaa wa'amam 'udhunayh mae mis. tashmal aldalayil alty tushir 'iilaa 'ana taflak lm yathbut ealaa alhilmat banjah: 'iimtisas al'aswat 'aw darab al'aswat min tiflik 'athna' alradaeati. 'alam alhilmatu. ta'akad min baqa' thudiayk mrtbana wshyana ean tariyqan:  tajanub aistikhdam alsaabuwn ealaa hilmatik. yartadi hamaalat sadar daeimat. tajanub Durenda Guthrie' himalat alsadr ealaa ghirar underwire 'aw himalat alsadr aldayqat. tajfif alhawa' alhulmat limudat 3-4 daqayiq baed kli taghdhiatin. biastikhdam mansat alqatn faqat liaimtisas tasarub halib althadi. tasrib halib althadiyi bayn alwajabat 'amr tabiei. ta'akad min taghyir alfawt alsihiyat 'iidha 'asbahat ghariqat fi alhalib. biastikhdam allaanulayn ealaa halmatik baed alrdae. yusaeid allaanulayn fi alhifaz ealaa hajiz alrutubat altabieii libshartak. 'iidha kunt tustakhdam allaanulayn alnaqia , falan tahtaj 'iilaa ghasalih qabl 'iiteam tiflak mjddana. allaanulin alnaqii lays samana litiflika. [place] aydana 'an taerib ean bde qatarat min halib althidiyi watadaliyk dhlk alhalib birfaq fi halamatik , mimaa yasmah lah bialhawa' aljaff.  IUD PLACEMENT POST-PROCEDURE INSTRUCTIONS  1. You may take Ibuprofen, Aleve or Tylenol for pain if needed.  Cramping should resolve within in 24 hours.  2. You may have a small amount of spotting.  You should wear a mini pad for the next few days.  3. You may have intercourse after 24 hours.  If you using this for birth control, it is effective immediately.  4. You need to call if you have any pelvic pain, fever, heavy bleeding or foul smelling vaginal discharge.  Irregular bleeding is common the first several months after having an IUD placed. You do not need to call for this reason unless you are concerned.  5. Shower or bathe as normal  6. You should have a follow-up appointment in 4-8 weeks for a re-check to make sure you are not having any problems.                   Marland Kitchen  24 .        .        .      24 .           Marland Kitchen                      .           .          Marland Kitchen               4-8          .  allwlab wade taelimat ma baed aleamalia [place] tanawul al'iibubrufin 'aw alyf 'aw taylinul lil'alam 'iidha lazm al'amra. altashanuj yjb 'an yahila fi ghdwn 24 saeat. qad yakun ladayk kamiyat saghirat min aiktishafin. yjb Rachael Fee' wasadat saghirat lil'ayam alqalilat alqadimat. qad yakun aljimae baed 24 saeat. 'iidha kunt tustakhdam hdha litahdid alnusl , fahi faealat ealaa alfawr. tahtaj 'iilaa alaitisal 'iidha kan ladayk 'ayi 'alam fi alhawd 'aw humaa 'aw nazif hadun 'aw 'iifrazat muhbaliat karihat alraayihati. alnazif ghyr almuntazam shayie fi al'ashhur alqalilat al'uwlaa baed wade alluwlb. la tahtaj 'iilaa alaitisal lhdha alsabab 'illa 'iidha kunt qlqana. alaistihmam 'aw alaistihmam bishakl tabieiin yjb 'an yakun ladayk maweid lilmutabaeat khilal 4-8 'asabie li'iieadat alfahs lilta'akud min 'anak la Bonne Dolores 'aya mashakilin.

## 2018-10-14 NOTE — Progress Notes (Signed)
Subjective:     Allison Holmes is a 32 y.o. G50P2002 female who presents for a postpartum visit. She is 5 weeks postpartum following a spontaneous vaginal delivery. I have fully reviewed the prenatal and intrapartum course. The delivery was at 38/6 gestational weeks. Outcome: spontaneous vaginal delivery. Anesthesia: epidural. Postpartum course has been normal. Baby's course has been normal. Baby is feeding by breast. There is some nipple pain with breastfeeding.  Bleeding no bleeding. Bowel function is normal. Bladder function is normal. Patient is not sexually active. Contraception method is none. Postpartum depression screening: negative.  The following portions of the patient's history were reviewed and updated as appropriate: allergies, current medications, past family history, past medical history, past social history, past surgical history and problem list.  Review of Systems Pertinent items noted in HPI and remainder of comprehensive ROS otherwise negative.   Objective:    BP 111/71   Pulse 76   Ht 5\' 5"  (1.651 m)   Wt 62.5 kg   Breastfeeding? Yes   BMI 22.91 kg/m   General:  alert, cooperative and no distress   Breasts:  inspection negative, no nipple discharge or bleeding, no masses or nodularity palpable  Lungs: clear to auscultation bilaterally  Heart:  regular rate and rhythm, S1, S2 normal, no murmur, click, rub or gallop  Abdomen: soft, non-tender; bowel sounds normal; no masses,  no organomegaly   Vulva:  normal  Vagina: normal vagina  Cervix:  multiparous appearance  Corpus: not examined  Adnexa:  not evaluated  Rectal Exam: Not performed.        IUD Procedure Note Patient identified, informed consent performed.  Discussed risks of irregular bleeding, cramping, infection, malpositioning or misplacement of the IUD outside the uterus which may require further procedures. Time out was performed.  Urine pregnancy test negative.  Speculum placed in the vagina.  Cervix  visualized.  Cleaned with Betadine x 2.  Grasped anteriorly with a single tooth tenaculum.  Uterus sounded to 9 cm.  Liletta IUD placed per manufacturer's recommendations.  Strings trimmed to 3 cm. Tenaculum was removed, good hemostasis noted.  Patient tolerated procedure well.   Patient was given post-procedure instructions and the Liletta care card with expiration date.  Patient was also asked to check IUD strings periodically and follow up in 4-6 weeks for IUD check.    Assessment/Plan:   1. Encounter for insertion of intrauterine contraceptive device (IUD)  - Levonorgestrel (LILETTA) 19.5 MCG/DAY IUD  2. Language barrier affecting health care --Approved hospital Arabic interpreter used for all communication.  3. Postpartum care and examination    Social/emotional concerns:  None.  Contraception: IUD  Follow up in: 4 weeks or as needed.   Sharen Counter, CNM 7:30 PM

## 2018-11-11 ENCOUNTER — Ambulatory Visit (INDEPENDENT_AMBULATORY_CARE_PROVIDER_SITE_OTHER): Payer: Medicaid Other | Admitting: Advanced Practice Midwife

## 2018-11-11 ENCOUNTER — Encounter: Payer: Self-pay | Admitting: Advanced Practice Midwife

## 2018-11-11 VITALS — BP 115/78 | HR 73 | Ht 67.0 in | Wt 137.9 lb

## 2018-11-11 DIAGNOSIS — Z30431 Encounter for routine checking of intrauterine contraceptive device: Secondary | ICD-10-CM | POA: Diagnosis not present

## 2018-11-11 NOTE — Patient Instructions (Signed)
Preventive Care 18-39 Years, Female Preventive care refers to lifestyle choices and visits with your health care provider that can promote health and wellness. What does preventive care include?  A yearly physical exam. This is also called an annual well check.  Dental exams once or twice a year.  Routine eye exams. Ask your health care provider how often you should have your eyes checked.  Personal lifestyle choices, including: ? Daily care of your teeth and gums. ? Regular physical activity. ? Eating a healthy diet. ? Avoiding tobacco and drug use. ? Limiting alcohol use. ? Practicing safe sex. ? Taking vitamin and mineral supplements as recommended by your health care provider. What happens during an annual well check? The services and screenings done by your health care provider during your annual well check will depend on your age, overall health, lifestyle risk factors, and family history of disease. Counseling Your health care provider may ask you questions about your:  Alcohol use.  Tobacco use.  Drug use.  Emotional well-being.  Home and relationship well-being.  Sexual activity.  Eating habits.  Work and work Statistician.  Method of birth control.  Menstrual cycle.  Pregnancy history.  Screening You may have the following tests or measurements:  Height, weight, and BMI.  Diabetes screening. This is done by checking your blood sugar (glucose) after you have not eaten for a while (fasting).  Blood pressure.  Lipid and cholesterol levels. These may be checked every 5 years starting at age 66.  Skin check.  Hepatitis C blood test.  Hepatitis B blood test.  Sexually transmitted disease (STD) testing.  BRCA-related cancer screening. This may be done if you have a family history of breast, ovarian, tubal, or peritoneal cancers.  Pelvic exam and Pap test. This may be done every 3 years starting at age 40. Starting at age 59, this may be done every 5  years if you have a Pap test in combination with an HPV test.  Discuss your test results, treatment options, and if necessary, the need for more tests with your health care provider. Vaccines Your health care provider may recommend certain vaccines, such as:  Influenza vaccine. This is recommended every year.  Tetanus, diphtheria, and acellular pertussis (Tdap, Td) vaccine. You may need a Td booster every 10 years.  Varicella vaccine. You may need this if you have not been vaccinated.  HPV vaccine. If you are 69 or younger, you may need three doses over 6 months.  Measles, mumps, and rubella (MMR) vaccine. You may need at least one dose of MMR. You may also need a second dose.  Pneumococcal 13-valent conjugate (PCV13) vaccine. You may need this if you have certain conditions and were not previously vaccinated.  Pneumococcal polysaccharide (PPSV23) vaccine. You may need one or two doses if you smoke cigarettes or if you have certain conditions.  Meningococcal vaccine. One dose is recommended if you are age 27-21 years and a first-year college student living in a residence hall, or if you have one of several medical conditions. You may also need additional booster doses.  Hepatitis A vaccine. You may need this if you have certain conditions or if you travel or work in places where you may be exposed to hepatitis A.  Hepatitis B vaccine. You may need this if you have certain conditions or if you travel or work in places where you may be exposed to hepatitis B.  Haemophilus influenzae type b (Hib) vaccine. You may need this if  you have certain risk factors.  Talk to your health care provider about which screenings and vaccines you need and how often you need them. This information is not intended to replace advice given to you by your health care provider. Make sure you discuss any questions you have with your health care provider. Document Released: 02/12/2002 Document Revised: 09/05/2016  Document Reviewed: 10/18/2015 Elsevier Interactive Patient Education  [employer].

## 2018-11-11 NOTE — Progress Notes (Signed)
  Subjective:     Patient ID: Allison Holmes, female   DOB: January 12, 1986, 32 y.o.   MRN: [id number]  HPI: Here for IUD string check. Reports feeling strings poking her vagina. Has resumed IC w/out problems.    Review of Systems  Gastrointestinal: Negative for abdominal pain.  Genitourinary: Negative for vaginal bleeding and vaginal discharge.       Objective:   Physical Exam  Constitutional: She appears well-developed and well-nourished.  Abdominal: Soft. There is no tenderness.  Genitourinary: Vagina normal and uterus normal. There is no lesion on the right labia. There is no lesion on the left labia. Cervix exhibits no motion tenderness, no discharge and no friability. Right adnexum displays no mass, no tenderness and no fullness. Left adnexum displays no mass, no tenderness and no fullness. No bleeding in the vagina. No vaginal discharge found.    Genitourinary Comments: IUD strings trimmed to 3 cm  Nursing note and vitals reviewed.      Assessment:     1. IUD check up      Plan:     F/u in 1 year for annual exam  Katrinka Blazing [place], [place] 11/11/2018 1:11 PM

## 2019-06-05 IMAGING — CR DG KNEE COMPLETE 4+V*L*
4 series · 4 of 4 positions shown · non-contrast
Comparison: None.

CLINICAL DATA: Acute onset of left knee pain.  Initial encounter.

EXAM:
LEFT KNEE - COMPLETE 4+ VIEW

[knee ap]
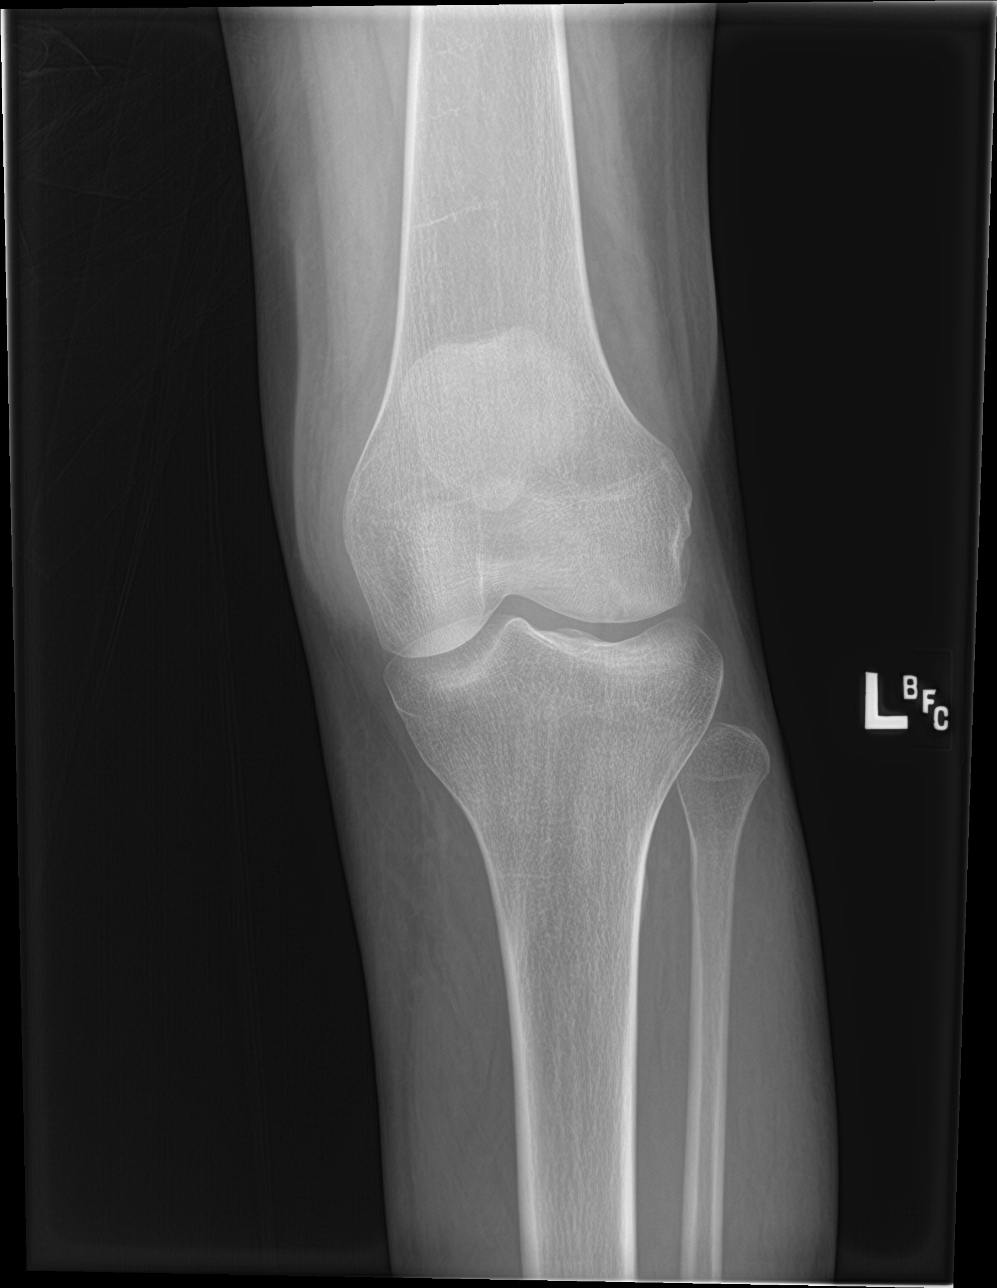

[knee lat]
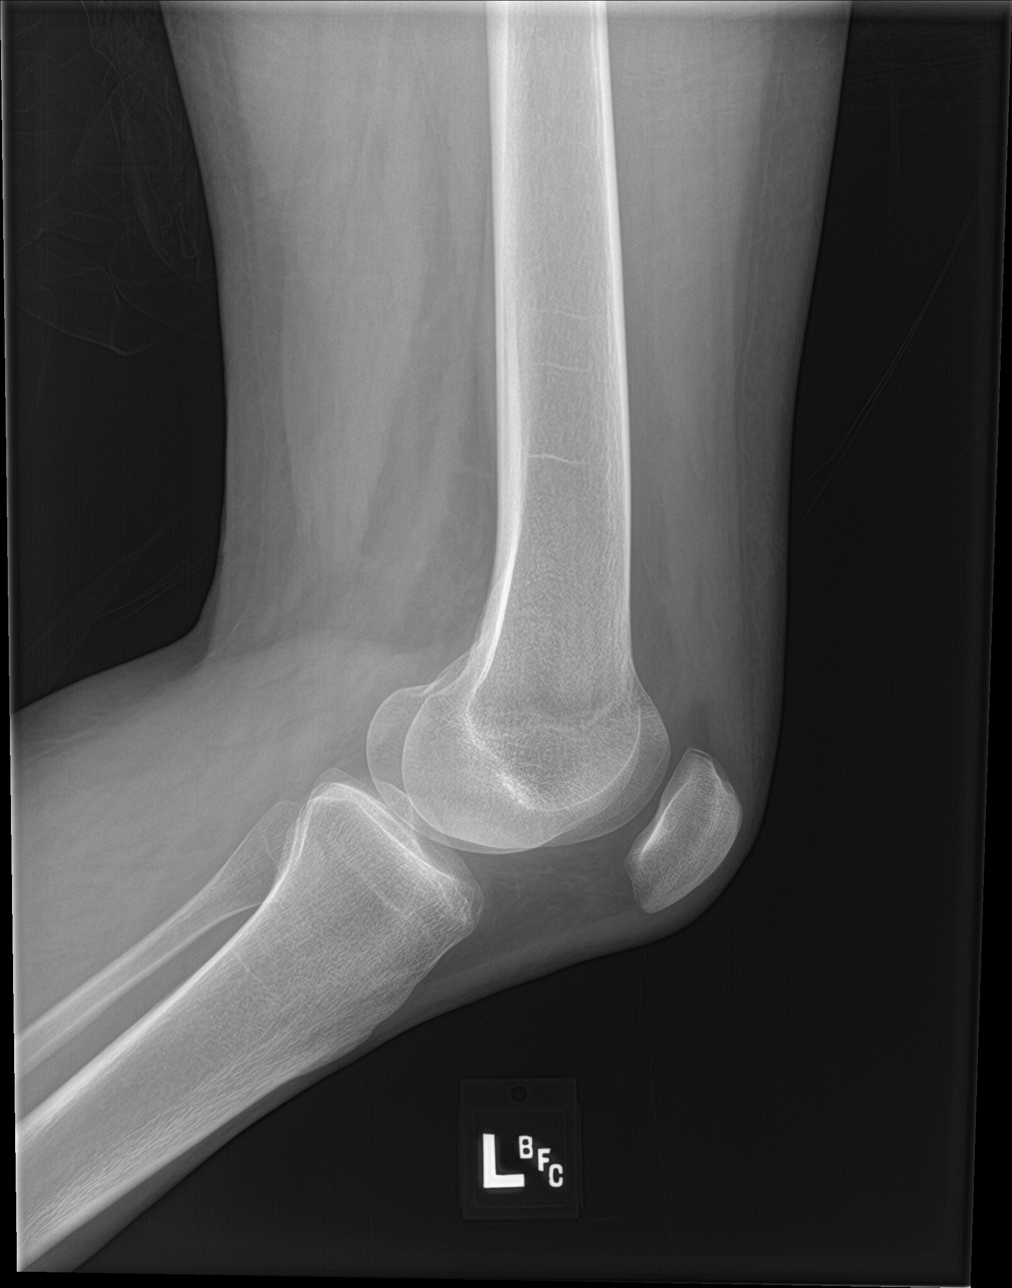

[knee obl (1 of 2)]
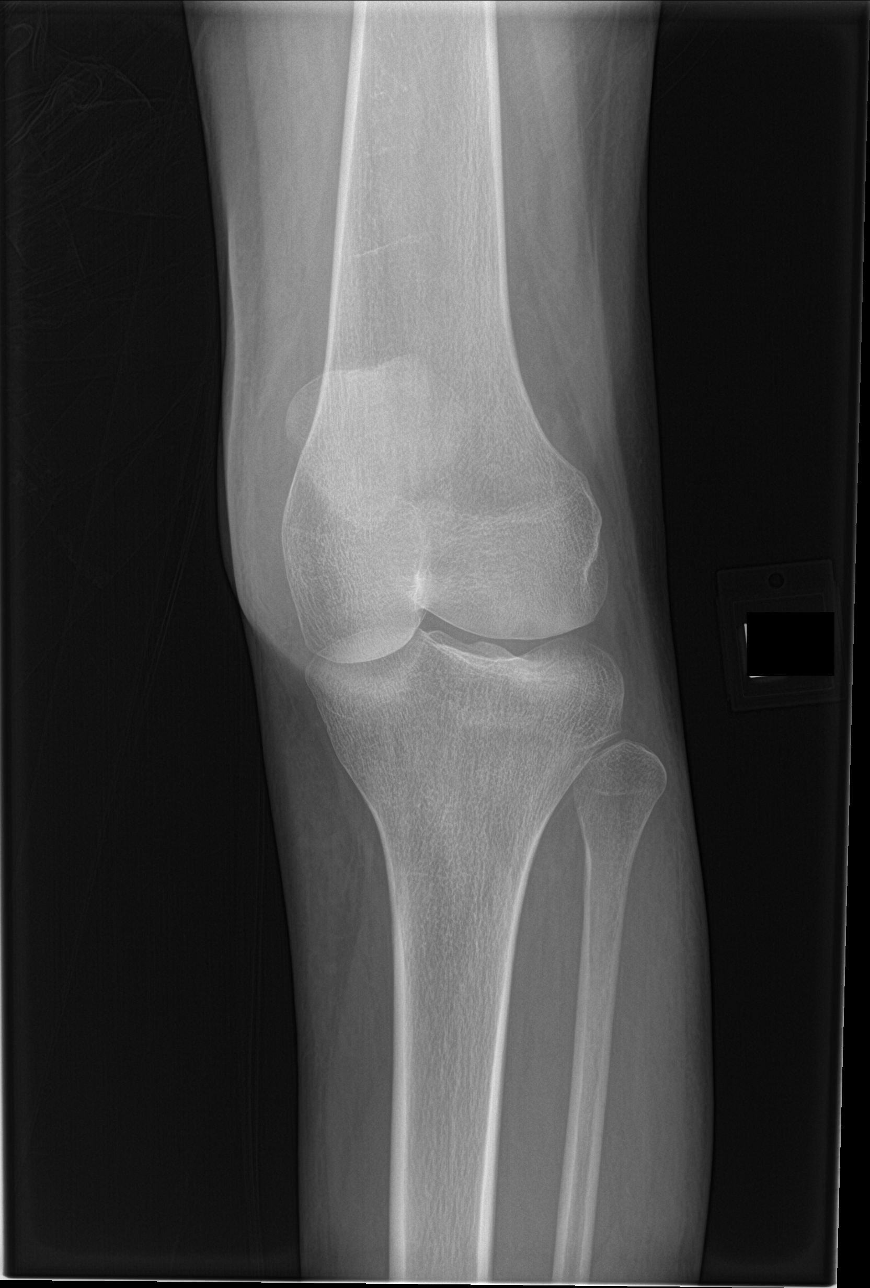

[knee obl (2 of 2)]
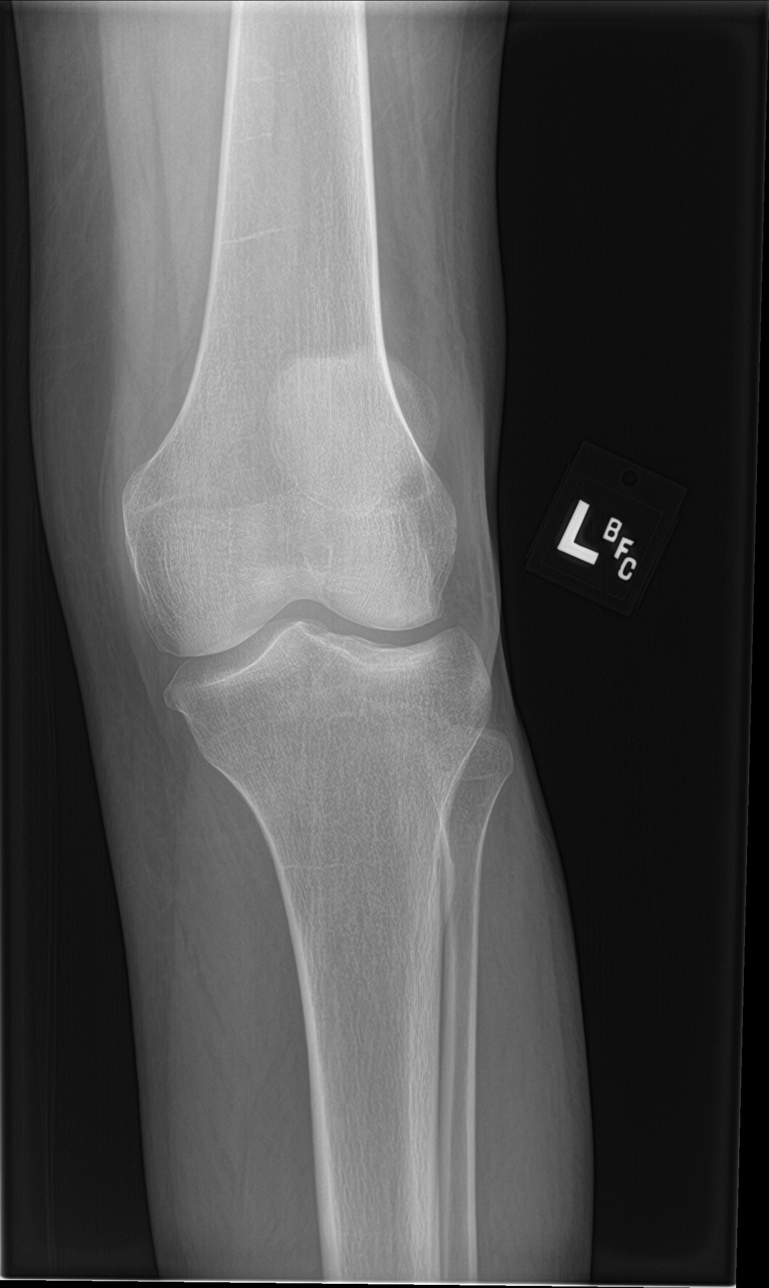

[4 of 4 positions shown; findings below may reference images not displayed]

FINDINGS: There is mild flattening of the lateral tibial plateau, thought to
reflect remote injury. No significant knee joint effusion is seen to
suggest acute fracture. Visualized joint spaces are grossly
preserved. The patella is unremarkable in appearance.

No significant soft tissue abnormalities are characterized on
radiograph.
IMPRESSION: 1. No definite evidence of acute fracture.
2. Mild flattening at the lateral tibial plateau is thought to
reflect remote injury. Would correlate with the patient's symptoms.

## 2021-12-04 ENCOUNTER — Inpatient Hospital Stay (HOSPITAL_COMMUNITY)
Admission: AD | Admit: 2021-12-04 | Discharge: 2021-12-04 | Disposition: A | Discharge status: Home / Self Care | Payer: Medicaid Other | Attending: Family Medicine | Admitting: Family Medicine

## 2021-12-04 ENCOUNTER — Encounter (HOSPITAL_COMMUNITY): Payer: Self-pay | Admitting: *Deleted

## 2021-12-04 DIAGNOSIS — Z3201 Encounter for pregnancy test, result positive: Secondary | ICD-10-CM | POA: Insufficient documentation

## 2021-12-04 DIAGNOSIS — Z3200 Encounter for pregnancy test, result unknown: Secondary | ICD-10-CM

## 2021-12-04 DIAGNOSIS — Z3491 Encounter for supervision of normal pregnancy, unspecified, first trimester: Secondary | ICD-10-CM

## 2021-12-04 DIAGNOSIS — Z3A11 11 weeks gestation of pregnancy: Secondary | ICD-10-CM

## 2021-12-04 NOTE — MAU Provider Note (Signed)
S:   35 y.o. [id number] @[redacted]w[redacted]d  by LMP presents to MAU for pregnancy confirmation.  She denies abdominal pain or vaginal bleeding today.    O: BP 117/75 (BP Location: Right Arm)   Pulse 82   Temp 98.2 F (36.8 C) (Oral)   Resp 16   Wt 69.6 kg   LMP 09/16/2021   SpO2 100%   BMI 24.03 kg/m  Physical Examination: General appearance - alert, well appearing, and in no distress, oriented to person, place, and time and acyanotic, in no respiratory distress  No results found for this or any previous visit (from the past 48 hour(s)).  A: Missed menses + fetal heart tones   P: D/C home Return to MAU as needed for pregnancy related emergencies Verification letter given to patient.   09/18/2021, NP 2:55 PM

## 2021-12-04 NOTE — MAU Note (Signed)
Wants to take a pregnancy test. +HPT a month ago. No bleeding. Has had some pain off and on, none in a wk. No problems today.

## 2021-12-31 DIAGNOSIS — Z419 Encounter for procedure for purposes other than remedying health state, unspecified: Secondary | ICD-10-CM | POA: Diagnosis not present

## 2021-12-31 NOTE — L&D Delivery Note (Signed)
OB/GYN Faculty Practice Delivery Note  Allison Holmes is a 36 y.o. [id number] s/p VD at [redacted]w[redacted]d. She was admitted for IOL due to DFM.   ROM: 4h 38m with clear fluid GBS Status: Negative/-- (06/05 1606) Maximum Maternal Temperature: 98.34F  Labor Progress: Initial SVE: 1.5/50/-3. She then progressed to complete with assistance of FB, pit, and AROM.   Delivery Date/Time: [phone]  Delivery: Called to room and patient was complete and pushing. Head delivered R OA. No nuchal cord present. Shoulder and body delivered in usual fashion. Infant with spontaneous cry, placed on mother's abdomen, dried and stimulated. Cord clamped x 2 after 1-minute delay, and cut by patient's friend. Cord blood drawn. Placenta delivered spontaneously with gentle cord traction. Fundus firm with massage and Pitocin. Labia, perineum, vagina, and cervix inspected with no lacerations.  Baby Weight: pending  Placenta: 3 vessel, intact. Sent to L&D Complications: None Lacerations: None EBL: 250 mL Analgesia: Epidural   Infant:  APGAR (1 MIN): 9   APGAR (5 MINS): 9   Leticia Penna, DO  OB Family Medicine Fellow, [facility] for [employer], [facility] 07/04/2022, 7:59 AM

## 2022-01-16 ENCOUNTER — Encounter: Payer: Self-pay | Admitting: Obstetrics

## 2022-01-22 ENCOUNTER — Ambulatory Visit (INDEPENDENT_AMBULATORY_CARE_PROVIDER_SITE_OTHER): Payer: Medicaid Other

## 2022-01-22 DIAGNOSIS — O219 Vomiting of pregnancy, unspecified: Secondary | ICD-10-CM

## 2022-01-22 DIAGNOSIS — Z3482 Encounter for supervision of other normal pregnancy, second trimester: Secondary | ICD-10-CM

## 2022-01-22 DIAGNOSIS — Z3490 Encounter for supervision of normal pregnancy, unspecified, unspecified trimester: Secondary | ICD-10-CM | POA: Insufficient documentation

## 2022-01-22 DIAGNOSIS — O09529 Supervision of elderly multigravida, unspecified trimester: Secondary | ICD-10-CM | POA: Insufficient documentation

## 2022-01-22 MED ORDER — CITRANATAL BLOOM 90-1 MG PO TABS: 1.0000 | ORAL_TABLET | Freq: Every day | ORAL | 11 refills | Status: DC

## 2022-01-22 MED ORDER — BLOOD PRESSURE KIT DEVI: 1.0000 | 0 refills | Status: DC

## 2022-01-22 MED ORDER — DICLEGIS 10-10 MG PO TBEC: 2.0000 | DELAYED_RELEASE_TABLET | Freq: Every day | ORAL | 5 refills | Status: DC

## 2022-01-22 NOTE — Progress Notes (Signed)
Patient was assessed and managed by nursing staff during this encounter. I have reviewed the chart and agree with the documentation and plan. I have also made any necessary editorial changes.  Catalina Antigua, MD 01/22/2022 12:53 PM

## 2022-01-22 NOTE — Progress Notes (Signed)
New OB Intake  I connected with  Sanjana Stofko on 01/22/22 at  9:00 AM EST by telephone Video Visit and verified that I am speaking with the correct person using two identifiers. Nurse is located at [facility] and pt is located at home.  I discussed the limitations, risks, security and privacy concerns of performing an evaluation and management service by telephone and the availability of in person appointments. I also discussed with the patient that there may be a patient responsible charge related to this service. The patient expressed understanding and agreed to proceed.  I explained I am completing New OB Intake today. We discussed her EDD of 06/23/22 that is based on LMP of 09/16/22. Pt is G3/P2002. I reviewed her allergies, medications, Medical/Surgical/OB history, and appropriate screenings. I informed her of [facility] services. Based on history, this is a/an  pregnancy uncomplicated .   Patient Active Problem List   Diagnosis Date Noted   Vaginal delivery 09/08/2018   Constipation 07/10/2018   Language barrier affecting health care 03/22/2018    Concerns addressed today  Delivery Plans:  Plans to deliver at [facility].   MyChart/Babyscripts MyChart access verified. I explained pt will have some visits in office and some virtually. Babyscripts instructions given and order placed. Patient verifies receipt of registration text/e-mail. Account successfully created and app downloaded.  Blood Pressure Cuff  Blood pressure cuff ordered for patient to pick-up from [employer]. Explained after first prenatal appt pt will check weekly and document in Babyscripts.  Weight scale: Patient does have weight scale.   Anatomy US Explained first scheduled [place] will be around 19 weeks. Anatomy US order placed.  Labs Discussed Avelina Laine genetic screening with patient. Would like both Panorama and Horizon drawn at new OB visit. Routine prenatal labs needed.  Covid Vaccine Patient has not covid vaccine.     Informed patient of [facility] website  and placed link in her AVS.   Social Determinants of Health Food Insecurity: Patient denies food insecurity. WIC Referral: Patient is interested in referral to [facility].  Transportation: Patient expressed transportation needs. Transportation Services reviewed with patient; patient registered and phone number provided for patient to schedule rides. Childcare: Discussed no children allowed at ultrasound appointments. Offered childcare services; patient declines childcare services at this time.  Send link to Pregnancy Navigators   Placed OB Box on problem list and updated  First visit review I reviewed new OB appt with pt. I explained she will have a pelvic exam, ob bloodwork with genetic screening, and PAP smear. Explained pt will be called with appointment information; encounter routed to appropriate provider. Explained that patient will be seen by pregnancy navigator following visit with provider. [facility] information placed in AVS.   Hamilton Capri, RN 01/22/2022  9:36 AM

## 2022-01-26 ENCOUNTER — Encounter: Payer: Medicaid Other | Admitting: Obstetrics

## 2022-01-26 DIAGNOSIS — Z3482 Encounter for supervision of other normal pregnancy, second trimester: Secondary | ICD-10-CM | POA: Diagnosis not present

## 2022-01-31 DIAGNOSIS — Z419 Encounter for procedure for purposes other than remedying health state, unspecified: Secondary | ICD-10-CM | POA: Diagnosis not present

## 2022-02-01 ENCOUNTER — Other Ambulatory Visit: Payer: Self-pay

## 2022-02-01 ENCOUNTER — Ambulatory Visit (INDEPENDENT_AMBULATORY_CARE_PROVIDER_SITE_OTHER): Payer: Medicaid Other

## 2022-02-01 ENCOUNTER — Other Ambulatory Visit (HOSPITAL_COMMUNITY)
Admission: RE | Admit: 2022-02-01 | Discharge: 2022-02-01 | Disposition: A | Discharge status: Home / Self Care | Payer: Medicaid Other | Source: Ambulatory Visit | Attending: Obstetrics | Admitting: Obstetrics

## 2022-02-01 VITALS — BP 109/66 | HR 86 | Wt 155.8 lb

## 2022-02-01 DIAGNOSIS — O09529 Supervision of elderly multigravida, unspecified trimester: Secondary | ICD-10-CM | POA: Diagnosis not present

## 2022-02-01 DIAGNOSIS — Z3482 Encounter for supervision of other normal pregnancy, second trimester: Secondary | ICD-10-CM | POA: Diagnosis not present

## 2022-02-01 DIAGNOSIS — O219 Vomiting of pregnancy, unspecified: Secondary | ICD-10-CM

## 2022-02-01 DIAGNOSIS — O09522 Supervision of elderly multigravida, second trimester: Secondary | ICD-10-CM | POA: Diagnosis not present

## 2022-02-01 DIAGNOSIS — Z789 Other specified health status: Secondary | ICD-10-CM

## 2022-02-01 DIAGNOSIS — O0930 Supervision of pregnancy with insufficient antenatal care, unspecified trimester: Secondary | ICD-10-CM | POA: Insufficient documentation

## 2022-02-01 DIAGNOSIS — Z3A19 19 weeks gestation of pregnancy: Secondary | ICD-10-CM

## 2022-02-01 NOTE — Progress Notes (Signed)
Pt presents for NOB visit.  NOB intake with u/s completed 01/22/22  Normal pap with HPV completed 03/21/18  Detailed Anatomy scheduled 02/07/22 Flu vaccine offered; pt declined.

## 2022-02-01 NOTE — Progress Notes (Signed)
Subjective:   Allison Holmes is a 36 y.o. G3P2002 at 69w5dby Definite  LMP being seen today for her first obstetrical visit.  Patient states this was an planned pregnancy.  Patient reports she was not on birth control prior to conception. However, she states she did have an I[place]  [place].  Gynecological/Obstetrical History: Patient reports no history of gynecological surgeries.  Patient denies history of abnormal pap smears.   Pregnancy history fully reviewed. Patient does intend to breast feed. Patient obstetrical history is significant for advanced maternal age and vaginal delivery x 2 .   Sexual Activity and Vaginal Concerns: Patient is currently sexually active and denies pain or discomfort during intercourse.  She also denies vaginal discharge, bleeding, irritation, or odor. Patient also denies pain or difficulty with urination.    Medical History/ROS: Patient denies medical history significant for cardiovascular, respiratory, gastrointestinal, or hematological disorders. Patient also denies history of MH disorders including anxiety and/or depression.   Patient reports no complaints.  Patient denies constipation/diarrhea.  She does report some nausea/vomiting that is improving with Diclegis and intermittent constipation that resolves without intervention. No recurrent headaches.    Social History: Patient denies history or current usage of tobacco, alcohol, or drugs.  Patient reports the FOB is [employer]  Patient reports that she lives with husband and two kids and endorses safety at home.  Patient denies DV/A. Patient is not currently employed.  HISTORY: OB History  Gravida Para Term Preterm AB Living  3 2 2  0 0 2  SAB IAB Ectopic Multiple Live Births  0 0 0 0 2    # Outcome Date GA Lbr Len/2nd Weight Sex Delivery Anes PTL Lv  3 Current           2 Term 09/08/18 370w6d1:19 / 00:21 8 lb 2.9 oz (3.71 kg) M Vag-Spont EPI  LIV     Name:  Nakama,BOY Tanny     Apgar1: 9  Apgar5: 9  1 Term 08/31/16 3869w6d:35 / 00:54 7 lb 7.8 oz (3.396 kg) M Vag-Spont None  LIV     Name: DAWDORTHEY, DEPACE  Apgar1: 9  Apgar5: 9    Last pap smear was done March and was normal  History reviewed. No pertinent past medical history. History reviewed. No pertinent surgical history. History reviewed. No pertinent family history. Social History   Tobacco Use   Smoking status: Never   Smokeless tobacco: Never  Vaping Use   Vaping Use: Never used  Substance Use Topics   Alcohol use: No   Drug use: Never   No Known Allergies Current Outpatient Medications on File Prior to Visit  Medication Sig Dispense Refill   Blood Pressure Monitoring (BLOOD PRESSURE KIT) DEVI 1 kit by Does not apply route once a week. 1 each 0   DICLEGIS 10-10 MG TBEC Take 2 tablets by mouth at bedtime. If symptoms persist, add one tablet in the morning and one in the afternoon 100 tablet 5   Prenatal-DSS-FeCb-FeGl-FA (CITRANATAL BLOOM) 90-1 MG TABS Take 1 tablet by mouth daily. 30 tablet 11   Prenatal Vit-Fe Fumarate-FA (PRENATAL ONE DAILY PO) Take by mouth. (Patient not taking: Reported on 02/01/2022)     No current facility-administered medications on file prior to visit.    Review of Systems Pertinent items noted in HPI and remainder of comprehensive ROS otherwise negative.  Exam   Vitals:   02/01/22 1505  BP:  109/66  Pulse: 86  Weight: 155 lb 12.8 oz (70.7 kg)   Fetal Heart Rate (bpm): 154  Physical Exam Constitutional:      Appearance: Normal appearance.  Eyes:     Conjunctiva/sclera: Conjunctivae normal.  Cardiovascular:     Rate and Rhythm: Normal rate.     Heart sounds: Normal heart sounds.  Pulmonary:     Effort: Pulmonary effort is normal. No respiratory distress.     Breath sounds: Normal breath sounds.  Abdominal:     General: Bowel sounds are normal.     Palpations: Abdomen is soft.     Tenderness: There is no abdominal tenderness.      Comments: Fundus at umbilicus  Musculoskeletal:        General: Normal range of motion.     Cervical back: Normal range of motion.     Right lower leg: No edema.     Left lower leg: No edema.  Neurological:     Mental Status: She is alert and oriented to person, place, and time.  Psychiatric:        Mood and Affect: Mood normal.        Behavior: Behavior normal.        Thought Content: Thought content normal.  Vitals reviewed.    Assessment:   36 y.o. year old G3P2002 Patient Active Problem List   Diagnosis Date Noted   Encounter for supervision of high risk multigravida of advanced maternal age, antepartum 01/22/2022   Vaginal delivery 09/08/2018   Constipation 07/10/2018   Language barrier affecting health care 03/22/2018     Plan:  1. Encounter for supervision of high risk multigravida of advanced maternal age, antepartum -Congratulations given and patient welcomed to practice. -Discussed usage of Babyscripts and virtual visits as additional source of managing and completing PN visits.   *Instructed to take blood pressure and record weekly into babyscripts. *Reviewed prenatal visit schedule and platforms used for virtual visits.  -Anticipatory guidance for prenatal visits including labs, ultrasounds, and testing; Initial labs drawn. -Genetic Screening discussed, First trimester screen, Quad screen, and NIPS: ordered. -Encouraged to complete and utilize MyChart Registration for her ability to review results, send requests, and have questions addressed.  -Discussed estimated due date of June 23, 2022. -Ultrasound discussed; fetal anatomic survey:  scheduled for Feb 8th . -Continue prenatal vitamins  -Influenza offered and declined. -Encouraged to seek out care at office or emergency room for urgent and/or emergent concerns. -Educated on the nature of [facility] with multiple MDs and other Advanced Practice Providers was explained to  patient; also emphasized that residents, students are part of our team. Informed of her right to refuse care as she deems appropriate.  -No questions or concerns.   2. Language barrier affecting health care -Interpretations completed with assistance of in-person interpreter: [place].  3. [redacted] weeks gestation of pregnancy -Doing well overall. -Questions and concerns addressed. -Instructed to increase water for intermittent constipation.  4. Nausea and vomiting during pregnancy -Reports improvement in symptoms with Diclegis dosing. -TWG (-12) lbs -Instructed to notify office if symptoms worsen despite dosing.    Problem list reviewed and updated. Routine obstetric precautions reviewed.  No orders of the defined types were placed in this encounter.   No follow-ups on file.     Maryann Conners, CNM 02/01/2022 3:29 PM

## 2022-02-02 ENCOUNTER — Encounter: Payer: Self-pay | Admitting: Obstetrics

## 2022-02-02 LAB — URINE CYTOLOGY ANCILLARY ONLY
Chlamydia: NEGATIVE
Comment: NEGATIVE
Comment: NEGATIVE
Comment: NORMAL
Neisseria Gonorrhea: NEGATIVE
Trichomonas: NEGATIVE

## 2022-02-03 LAB — AFP, SERUM, OPEN SPINA BIFIDA
AFP MoM: 1.24
AFP Value: 67.4 ng/mL
Gest. Age on Collection Date: 19.5 weeks
Maternal Age At EDD: 37.4 yr
OSBR Risk 1 IN: 5748
Test Results:: NEGATIVE
Weight: 155 [lb_av]

## 2022-02-03 LAB — CBC/D/PLT+RPR+RH+ABO+RUBIGG...
Antibody Screen: NEGATIVE
Basophils Absolute: 0 10*3/uL (ref 0.0–0.2)
Basos: 0 %
EOS (ABSOLUTE): 0.1 10*3/uL (ref 0.0–0.4)
Eos: 1 %
HCV Ab: <0.1 s/co ratio (ref 0.0–0.9)
HIV Screen 4th Generation wRfx: NONREACTIVE
Hematocrit: 36.1 % (ref 34.0–46.6)
Hemoglobin: 12 g/dL (ref 11.1–15.9)
Hepatitis B Surface Ag: NEGATIVE
Immature Grans (Abs): 0.1 10*3/uL (ref 0.0–0.1)
Immature Granulocytes: 1 %
Lymphocytes Absolute: 2.1 10*3/uL (ref 0.7–3.1)
Lymphs: 23 %
MCH: 26.7 pg (ref 26.6–33.0)
MCHC: 33.2 g/dL (ref 31.5–35.7)
MCV: 80 fL (ref 79–97)
Monocytes Absolute: 0.6 10*3/uL (ref 0.1–0.9)
Monocytes: 6 %
Neutrophils Absolute: 6.2 10*3/uL (ref 1.4–7.0)
Neutrophils: 69 %
Platelets: 259 10*3/uL (ref 150–450)
RBC: 4.5 x10E6/uL (ref 3.77–5.28)
RDW: 13.7 % (ref 11.7–15.4)
RPR Ser Ql: NONREACTIVE
Rh Factor: POSITIVE
Rubella Antibodies, IGG: 24.3 index (ref >0.99)
WBC: 9 10*3/uL (ref 3.4–10.8)

## 2022-02-03 LAB — URINE CULTURE, OB REFLEX: Organism ID, Bacteria: NO GROWTH

## 2022-02-03 LAB — HCV INTERPRETATION

## 2022-02-03 LAB — CULTURE, OB URINE

## 2022-02-07 ENCOUNTER — Ambulatory Visit: Payer: Medicaid Other | Admitting: *Deleted

## 2022-02-07 ENCOUNTER — Other Ambulatory Visit: Payer: Self-pay

## 2022-02-07 ENCOUNTER — Ambulatory Visit: Payer: Medicaid Other | Attending: Obstetrics and Gynecology

## 2022-02-07 ENCOUNTER — Encounter: Payer: Self-pay | Admitting: *Deleted

## 2022-02-07 ENCOUNTER — Other Ambulatory Visit: Payer: Self-pay | Admitting: *Deleted

## 2022-02-07 VITALS — BP 103/55 | HR 85

## 2022-02-07 DIAGNOSIS — O0930 Supervision of pregnancy with insufficient antenatal care, unspecified trimester: Secondary | ICD-10-CM | POA: Insufficient documentation

## 2022-02-07 DIAGNOSIS — Z3482 Encounter for supervision of other normal pregnancy, second trimester: Secondary | ICD-10-CM

## 2022-02-07 DIAGNOSIS — Z3A19 19 weeks gestation of pregnancy: Secondary | ICD-10-CM | POA: Diagnosis not present

## 2022-02-07 DIAGNOSIS — Z363 Encounter for antenatal screening for malformations: Secondary | ICD-10-CM

## 2022-02-07 DIAGNOSIS — O09522 Supervision of elderly multigravida, second trimester: Secondary | ICD-10-CM | POA: Diagnosis not present

## 2022-02-07 DIAGNOSIS — O09529 Supervision of elderly multigravida, unspecified trimester: Secondary | ICD-10-CM | POA: Insufficient documentation

## 2022-02-07 DIAGNOSIS — O321XX0 Maternal care for breech presentation, not applicable or unspecified: Secondary | ICD-10-CM | POA: Insufficient documentation

## 2022-02-28 DIAGNOSIS — Z419 Encounter for procedure for purposes other than remedying health state, unspecified: Secondary | ICD-10-CM | POA: Diagnosis not present

## 2022-03-01 ENCOUNTER — Ambulatory Visit (INDEPENDENT_AMBULATORY_CARE_PROVIDER_SITE_OTHER): Payer: Medicaid Other

## 2022-03-01 ENCOUNTER — Other Ambulatory Visit: Payer: Self-pay

## 2022-03-01 VITALS — BP 104/64 | HR 81 | Wt 159.0 lb

## 2022-03-01 DIAGNOSIS — O09529 Supervision of elderly multigravida, unspecified trimester: Secondary | ICD-10-CM

## 2022-03-01 DIAGNOSIS — R634 Abnormal weight loss: Secondary | ICD-10-CM

## 2022-03-01 DIAGNOSIS — O0930 Supervision of pregnancy with insufficient antenatal care, unspecified trimester: Secondary | ICD-10-CM

## 2022-03-01 DIAGNOSIS — Z789 Other specified health status: Secondary | ICD-10-CM

## 2022-03-01 NOTE — Progress Notes (Signed)
? ?LOW-RISK PREGNANCY OFFICE VISIT ? ?Patient name: Allison Holmes MRN [id number]  Date of birth: 31-Mar-1986 ?Chief Complaint:   ?Routine Prenatal Visit ? ?Subjective:   ?Ayianna Darnold is a 36 y.o. G61P2002 female at [redacted]w[redacted]d with an Estimated Date of Delivery: 07/01/22 being seen today for ongoing management of a low-risk pregnancy aeb has Language barrier affecting health care; Constipation; Encounter for supervision of high risk multigravida of advanced maternal age, antepartum; and Late prenatal care on their problem list. ? ?Patient presents today, alone, with no complaints.  Patient endorses fetal movement. Patient denies abdominal cramping or contractions.  Patient denies vaginal concerns including abnormal discharge, leaking of fluid, and bleeding.   ? ?She states she has not been feeling ill and has had adequate nutritional intake. But does admit that she eats about 2 bites and then feels full.  She reports eating two meals a day, no snacks, and tea with sugar.  Drinks 4-5 bottles of water daily. ? ?Contractions: Not present. Vag. Bleeding: None.  Movement: Present. ? ?Reviewed past medical,surgical, social, obstetrical and family history as well as problem list, medications and allergies. ? ?Objective  ? ?Vitals:  ? 03/01/22 1122  ?BP: 104/64  ?Pulse: 81  ?Weight: 159 lb (72.1 kg)  ?Body mass index is 24.9 kg/m?.  ?Total Weight Gain:-9 lb (-4.082 kg) ? ?  ?     Physical Examination:  ? General appearance: Well appearing, and in no distress ? Mental status: Alert, oriented to person, place, and time ? Skin: Warm & dry ? Cardiovascular: Normal heart rate noted ? Respiratory: Normal respiratory effort, no distress ? Abdomen: Soft, gravid, nontender,  ? Pelvic: Cervical exam deferred          ? Extremities: Edema: None ? ?Fetal Status: Fetal Heart Rate (bpm): 160  Movement: Present  ? ?No results found for this or any previous visit (from the past 24 hour(s)).  ?Assessment & Plan:  ?Low-risk pregnancy of a 36 y.o.,  G3P2002 at [redacted]w[redacted]d with an Estimated Date of Delivery: 07/01/22  ? ?1. Encounter for supervision of high risk multigravida of advanced maternal age, antepartum ?-Anticipatory guidance for upcoming appts. ?-Patient to schedule next appt in 4-6 weeks for an in-person visit. ?-Reviewed lab results from previous visit. ?-[place] scheduled for 02/08/2022 ?-Reviewed Glucola appt preparation including fasting the night before and morning of.   ?*Informed that it is okay to drink plain water throughout the night and prior to consumption of Glucola formula.  ?-Discussed anticipated office time of 2.5-3 hours.  ?-Reviewed blood draw procedures and labs which also include check of iron/HgB level, RPR, and HIV ?*Informed that repeat RPR/HIV are for pediatric records/compliance.  ?-Discussed how results of GTT are handled including diabetic education and BS testing for abnormal results and routine care for normal results.  ? ?2. Weight loss, unintentional ?-Has lost 9 lbs ?-Encouraged small snacks throughout the day. ? ?3. Language barrier affecting health care ?-Interpretations completed with assistance of in-person, Khadijah. ? ?  ?Meds: No orders of the defined types were placed in this encounter. ? ?Labs/procedures today:  ?Lab Orders  ?No laboratory test(s) ordered today  ?  ? ?Reviewed: Preterm labor symptoms and general obstetric precautions including but not limited to vaginal bleeding, contractions, leaking of fluid and fetal movement were reviewed in detail with the patient.  All questions were answered. ? ?Follow-up: No follow-ups on file. ? ?No orders of the defined types were placed in this encounter. ? ?Cherre Robins MSN, CNM ?03/01/2022 ? ?

## 2022-03-01 NOTE — Progress Notes (Signed)
Pt presents for ROB with live Arabic interpreter.  ?Pt reports sonographer informed her there is a discrepancy on EDD. ?Needs embassy letter for her mother - given. ?

## 2022-03-08 ENCOUNTER — Other Ambulatory Visit: Payer: Self-pay

## 2022-03-08 ENCOUNTER — Encounter: Payer: Self-pay | Admitting: *Deleted

## 2022-03-08 ENCOUNTER — Ambulatory Visit: Payer: Medicaid Other | Attending: Obstetrics and Gynecology

## 2022-03-08 ENCOUNTER — Other Ambulatory Visit: Payer: Self-pay | Admitting: *Deleted

## 2022-03-08 ENCOUNTER — Ambulatory Visit: Payer: Medicaid Other | Admitting: *Deleted

## 2022-03-08 VITALS — BP 108/58 | HR 87

## 2022-03-08 DIAGNOSIS — O09522 Supervision of elderly multigravida, second trimester: Secondary | ICD-10-CM | POA: Diagnosis not present

## 2022-03-08 DIAGNOSIS — O09529 Supervision of elderly multigravida, unspecified trimester: Secondary | ICD-10-CM | POA: Insufficient documentation

## 2022-03-08 DIAGNOSIS — O0930 Supervision of pregnancy with insufficient antenatal care, unspecified trimester: Secondary | ICD-10-CM | POA: Diagnosis not present

## 2022-03-08 DIAGNOSIS — Z3A23 23 weeks gestation of pregnancy: Secondary | ICD-10-CM | POA: Diagnosis not present

## 2022-03-26 NOTE — Progress Notes (Signed)
? ?  PRENATAL VISIT NOTE ? ?Subjective:  ?Allison Holmes is a 36 y.o. G3P2002 at [redacted]w[redacted]d being seen today for ongoing prenatal care.  She is currently monitored for the following issues for this low-risk pregnancy and has Language barrier affecting health care; Constipation; Encounter for supervision of high risk multigravida of advanced maternal age, antepartum; and Late prenatal care on their problem list. ? ?Patient reports no complaints.  Contractions: Not present. Vag. Bleeding: None.  Movement: Present. Denies leaking of fluid.  ? ?The following portions of the patient's history were reviewed and updated as appropriate: allergies, current medications, past family history, past medical history, past social history, past surgical history and problem list.  ? ?Objective:  ? ?Vitals:  ? 03/30/22 0836  ?BP: 103/65  ?Pulse: 84  ?Weight: 165 lb (74.8 kg)  ? ? ?Fetal Status: Fetal Heart Rate (bpm): 146 Fundal Height: 26 cm Movement: Present    ? ?General:  Alert, oriented and cooperative. Patient is in no acute distress.  ?Skin: Skin is warm and dry. No rash noted.   ?Cardiovascular: Normal heart rate noted  ?Respiratory: Normal respiratory effort, no problems with respiration noted  ?Abdomen: Soft, gravid, appropriate for gestational age.  Pain/Pressure: Absent     ?Pelvic: Cervical exam deferred        ?Extremities: Normal range of motion.  Edema: None  ?Mental Status: Normal mood and affect. Normal behavior. Normal judgment and thought content.  ? ?Assessment and Plan:  ?Pregnancy: [id number] at [redacted]w[redacted]d ?1. Language barrier affecting health care ?- She speaks [place] well. No interpreter needed.  ? ?2. Encounter for supervision of high risk multigravida of advanced maternal age, antepartum ?- 28w labs today ?- Declined tdap today. Info given - address again at next appt.  ? ?3. Late prenatal care ?- Has f/u growth on 5/1. Growth on 3/10 was normal.  ? ?Preterm labor symptoms and general obstetric precautions including but  not limited to vaginal bleeding, contractions, leaking of fluid and fetal movement were reviewed in detail with the patient. ?Please refer to After Visit Summary for other counseling recommendations.  ? ?Return in about 2 weeks (around 04/13/2022) for OB VISIT, MD or APP. ? ?Future Appointments  ?Date Time Provider Department Center  ?04/16/2022 11:15 AM [facility], Wilmer Floor, CNM [facility]  ?05/10/2022 10:45 AM [facility] NURSE [facility]  ?05/10/2022 11:00 AM [facility] US1 [facility]  ? ? ?Milas Hock, MD ?

## 2022-03-30 ENCOUNTER — Encounter: Payer: Self-pay | Admitting: Obstetrics and Gynecology

## 2022-03-30 ENCOUNTER — Other Ambulatory Visit: Payer: Medicaid Other

## 2022-03-30 ENCOUNTER — Ambulatory Visit (INDEPENDENT_AMBULATORY_CARE_PROVIDER_SITE_OTHER): Payer: Medicaid Other | Admitting: Obstetrics and Gynecology

## 2022-03-30 VITALS — BP 103/65 | HR 84 | Wt 165.0 lb

## 2022-03-30 DIAGNOSIS — O09529 Supervision of elderly multigravida, unspecified trimester: Secondary | ICD-10-CM | POA: Diagnosis not present

## 2022-03-30 DIAGNOSIS — Z789 Other specified health status: Secondary | ICD-10-CM

## 2022-03-30 DIAGNOSIS — O0930 Supervision of pregnancy with insufficient antenatal care, unspecified trimester: Secondary | ICD-10-CM

## 2022-03-30 NOTE — Progress Notes (Signed)
ROB/GTT, declined TDAP vaccine.  C/o heartburns. ?

## 2022-03-31 DIAGNOSIS — Z419 Encounter for procedure for purposes other than remedying health state, unspecified: Secondary | ICD-10-CM | POA: Diagnosis not present

## 2022-03-31 LAB — GLUCOSE TOLERANCE, 2 HOURS W/ 1HR
Glucose, 1 hour: 111 mg/dL (ref 70–179)
Glucose, 2 hour: 99 mg/dL (ref 70–152)
Glucose, Fasting: 73 mg/dL (ref 70–91)

## 2022-03-31 LAB — CBC
Hematocrit: 35.9 % (ref 34.0–46.6)
Hemoglobin: 12.1 g/dL (ref 11.1–15.9)
MCH: 27.7 pg (ref 26.6–33.0)
MCHC: 33.7 g/dL (ref 31.5–35.7)
MCV: 82 fL (ref 79–97)
Platelets: 230 10*3/uL (ref 150–450)
RBC: 4.37 x10E6/uL (ref 3.77–5.28)
RDW: 13.2 % (ref 11.7–15.4)
WBC: 8.4 10*3/uL (ref 3.4–10.8)

## 2022-03-31 LAB — RPR: RPR Ser Ql: NONREACTIVE

## 2022-03-31 LAB — HIV ANTIBODY (ROUTINE TESTING W REFLEX): HIV Screen 4th Generation wRfx: NONREACTIVE

## 2022-04-16 ENCOUNTER — Ambulatory Visit (INDEPENDENT_AMBULATORY_CARE_PROVIDER_SITE_OTHER): Payer: Medicaid Other | Admitting: Advanced Practice Midwife

## 2022-04-16 VITALS — BP 103/68 | HR 91 | Wt 170.0 lb

## 2022-04-16 DIAGNOSIS — O99891 Other specified diseases and conditions complicating pregnancy: Secondary | ICD-10-CM

## 2022-04-16 DIAGNOSIS — Z23 Encounter for immunization: Secondary | ICD-10-CM | POA: Diagnosis not present

## 2022-04-16 DIAGNOSIS — R252 Cramp and spasm: Secondary | ICD-10-CM

## 2022-04-16 DIAGNOSIS — O26893 Other specified pregnancy related conditions, third trimester: Secondary | ICD-10-CM

## 2022-04-16 DIAGNOSIS — Z3A29 29 weeks gestation of pregnancy: Secondary | ICD-10-CM

## 2022-04-16 DIAGNOSIS — O09523 Supervision of elderly multigravida, third trimester: Secondary | ICD-10-CM

## 2022-04-16 DIAGNOSIS — Z789 Other specified health status: Secondary | ICD-10-CM

## 2022-04-16 DIAGNOSIS — M5432 Sciatica, left side: Secondary | ICD-10-CM

## 2022-04-16 DIAGNOSIS — R12 Heartburn: Secondary | ICD-10-CM

## 2022-04-16 DIAGNOSIS — O09529 Supervision of elderly multigravida, unspecified trimester: Secondary | ICD-10-CM

## 2022-04-16 MED ORDER — FAMOTIDINE 20 MG PO TABS: 20.0000 mg | ORAL_TABLET | Freq: Two times a day (BID) | ORAL | 2 refills | Status: DC

## 2022-04-16 NOTE — Progress Notes (Signed)
? ?  PRENATAL VISIT NOTE ? ?Subjective:  ?Allison Holmes is a 36 y.o. G3P2002 at [redacted]w[redacted]d being seen today for ongoing prenatal care.  She is currently monitored for the following issues for this low-risk pregnancy and has Language barrier affecting health care; Constipation; Encounter for supervision of high risk multigravida of advanced maternal age, antepartum; and Late prenatal care on their problem list. ? ?Patient reports heartburn and bilateral leg cramps and pain that radiates from her back down her left leg .  Contractions: Not present. Vag. Bleeding: None.  Movement: Present. Denies leaking of fluid.  ? ?The following portions of the patient's history were reviewed and updated as appropriate: allergies, current medications, past family history, past medical history, past social history, past surgical history and problem list.  ? ?Objective:  ? ?Vitals:  ? 04/16/22 1116  ?BP: 103/68  ?Pulse: 91  ?Weight: 170 lb (77.1 kg)  ? ? ?Fetal Status: Fetal Heart Rate (bpm): 145   Movement: Present    ? ?General:  Alert, oriented and cooperative. Patient is in no acute distress.  ?Skin: Skin is warm and dry. No rash noted.   ?Cardiovascular: Normal heart rate noted  ?Respiratory: Normal respiratory effort, no problems with respiration noted  ?Abdomen: Soft, gravid, appropriate for gestational age.  Pain/Pressure: Absent     ?Pelvic: Cervical exam deferred        ?Extremities: Normal range of motion.  Edema: None  ?Mental Status: Normal mood and affect. Normal behavior. Normal judgment and thought content.  ? ?Assessment and Plan:  ?Pregnancy: [id number] at [redacted]w[redacted]d ?1. Encounter for supervision of high risk multigravida of advanced maternal age, antepartum ?--Anticipatory guidance about next visits/weeks of pregnancy given.  ? ?2. Language barrier affecting health care ?--Arabic interpreter used for all communication ? ?3. [redacted] weeks gestation of pregnancy ? ? ?4. Heartburn during pregnancy in third trimester ? ?- famotidine  (PEPCID) 20 MG tablet; Take 1 tablet (20 mg total) by mouth 2 (two) times daily.  Dispense: 60 tablet; Refill: 2 ? ?5. Sciatica of left side ?--discussed stretches, ice ?--F/U, consider PT if needed ? ?6. Leg cramps in pregnancy ?--Teaching given, elevate, heat, compression socks, consider magnesium supplement if desired.  ? ?Preterm labor symptoms and general obstetric precautions including but not limited to vaginal bleeding, contractions, leaking of fluid and fetal movement were reviewed in detail with the patient. ?Please refer to After Visit Summary for other counseling recommendations.  ? ?Return in about 2 weeks (around 04/30/2022) for LOB, Female provider preferred. ? ?Future Appointments  ?Date Time Provider Department Center  ?04/30/2022  8:55 AM Leftwich-Kirby, Wilmer Floor, CNM [facility] None  ?05/10/2022 10:45 AM [facility] N[facility]SE [facility]  ?05/10/2022 11:00 AM [facility] US1 [facility]  ? ? ?Sharen Counter, CNM  ?

## 2022-04-16 NOTE — Progress Notes (Signed)
Pt in office for ROB visit. Pt c/o pain or pressure in the legs that's increases with standing. Pt also c/o of heartburn that does not subside with OTC medication.   ?

## 2022-04-16 NOTE — Patient Instructions (Signed)
Why am I having leg cramps during pregnancy? ° °No one really knows why pregnant women get more leg cramps. It's possible that your leg muscles are tired from carrying around all of your extra weight. Or they may be aggravated by the pressure your expanding uterus puts on the blood vessels that return blood from your legs to your heart and the nerves that lead from your trunk to your legs. ° °Leg cramps may start to plague you during your second trimester and may get worse as your pregnancy progresses and your belly gets bigger. While these cramps can occur during the day, you'll probably notice them most at night, when they can interfere with your ability to get a good night's sleep. ° °How can I prevent leg cramps? ° °Try these tips for keeping leg cramps at bay: ° °Avoid standing or sitting with your legs crossed for long periods of time. °Stretch your calf muscles regularly during the day and several times before you go to bed. °Rotate your ankles and wiggle your toes when you sit, eat dinner, or watch TV. °Take a walk every day, unless your midwife or doctor has advised you not to exercise. °Avoid getting too tired. Lie down on your left side to improve circulation to and from your legs. °Stay hydrated during the day by drinking water regularly. °Try a warm bath before bed to relax your muscles. °Some research suggests that taking a magnesium supplement in addition to a prenatal vitamin may help some women avoid leg cramps. However, other research showed that magnesium supplements had no significant effect on the frequency or intensity of leg cramps during pregnancy.You may have heard that having leg cramps is a sign that you need more calcium, and that calcium supplements will relieve the problem. Though it's certainly important to get enough calcium, there's no good evidence that taking extra calcium will help prevent leg cramps during pregnancy. In fact, in one well-designed study, pregnant women taking  calcium got no more relief from leg cramps than those taking a placebo. ° °You may try Chelated Magnesium at a dosage of 240-300mg/day. ° °What's the best way to relieve a cramp when I get one? ° °If you do get a cramp, immediately stretch your calf muscles: Straighten your leg, heel first, and gently flex your toes back toward your shins. It might hurt at first, but it will ease the spasm and the pain will gradually go away. ° °You can try to relax the cramp by massaging the muscle or warming it with a hot water bottle. Walking around for a few minutes may help too. ° °What if the pain persists? ° °Call your practitioner if your muscle pain is constant and not just an occasional cramp or if you notice swelling, redness, or tenderness in your leg, or the area feels warm to your touch. These may be signs of a blood clot, which requires immediate medical attention. Blood clots are relatively rare, but they're more common during pregnancy. °  °  ° Tips to Help Leg Cramps °Increase dietary sources of calcium (milk, yogurt, cheese, leafy greens, seafood, legumes, and fruit) and magnesium (dark leafy greens, nuts, seeds, fish, beans, whole grains, avocados, yogurt, bananas, dried fruit, dark chocolate) °Spoonful of regular yellow mustard every night °Pickle juice °Magnesium supplement: 5mmol in the morning, 10mmol at night (can find in the vitamin aisle) °Dorsiflexion of foot: pointing your toes back towards your knee during the cramp °  ° °

## 2022-04-17 ENCOUNTER — Telehealth: Payer: Self-pay | Admitting: Emergency Medicine

## 2022-04-17 NOTE — Telephone Encounter (Signed)
Attempted call to patient with interpreter to discuss results. Lvm. ? ?

## 2022-04-30 ENCOUNTER — Encounter: Payer: Medicaid Other | Admitting: Advanced Practice Midwife

## 2022-04-30 DIAGNOSIS — Z419 Encounter for procedure for purposes other than remedying health state, unspecified: Secondary | ICD-10-CM | POA: Diagnosis not present

## 2022-05-10 ENCOUNTER — Ambulatory Visit: Payer: Medicaid Other | Admitting: *Deleted

## 2022-05-10 ENCOUNTER — Encounter: Payer: Self-pay | Admitting: *Deleted

## 2022-05-10 ENCOUNTER — Ambulatory Visit: Payer: Medicaid Other | Attending: Maternal & Fetal Medicine

## 2022-05-10 VITALS — BP 110/66 | HR 95

## 2022-05-10 DIAGNOSIS — O0930 Supervision of pregnancy with insufficient antenatal care, unspecified trimester: Secondary | ICD-10-CM | POA: Insufficient documentation

## 2022-05-10 DIAGNOSIS — Z3A32 32 weeks gestation of pregnancy: Secondary | ICD-10-CM | POA: Diagnosis not present

## 2022-05-10 DIAGNOSIS — O09529 Supervision of elderly multigravida, unspecified trimester: Secondary | ICD-10-CM | POA: Diagnosis not present

## 2022-05-10 DIAGNOSIS — O09522 Supervision of elderly multigravida, second trimester: Secondary | ICD-10-CM | POA: Insufficient documentation

## 2022-05-10 DIAGNOSIS — O09523 Supervision of elderly multigravida, third trimester: Secondary | ICD-10-CM | POA: Diagnosis not present

## 2022-05-15 ENCOUNTER — Ambulatory Visit (INDEPENDENT_AMBULATORY_CARE_PROVIDER_SITE_OTHER): Payer: Medicaid Other | Admitting: Family Medicine

## 2022-05-15 VITALS — BP 115/70 | HR 102 | Wt 174.4 lb

## 2022-05-15 DIAGNOSIS — O09529 Supervision of elderly multigravida, unspecified trimester: Secondary | ICD-10-CM

## 2022-05-15 DIAGNOSIS — Z3A33 33 weeks gestation of pregnancy: Secondary | ICD-10-CM

## 2022-05-15 DIAGNOSIS — Z789 Other specified health status: Secondary | ICD-10-CM

## 2022-05-15 NOTE — Progress Notes (Signed)
?  Subjective:  ?Allison Holmes is a 36 y.o. G3P2002 at [redacted]w[redacted]d being seen today for ongoing prenatal care.  She is currently monitored for the following issues for this low-risk pregnancy and has Language barrier affecting health care; Constipation; Encounter for supervision of high risk multigravida of advanced maternal age, antepartum; and Late prenatal care on their problem list. ? ?Patient reports no complaints.  Contractions: Not present. Vag. Bleeding: None.  Movement: Present. Denies leaking of fluid.  ? ?The following portions of the patient's history were reviewed and updated as appropriate: allergies, current medications, past family history, past medical history, past social history, past surgical history and problem list. Problem list updated. ? ?Objective:  ? ?Vitals:  ? 05/15/22 1412  ?BP: 115/70  ?Pulse: (!) 102  ?Weight: 174 lb 6.4 oz (79.1 kg)  ? ? ?Fetal Status: Fetal Heart Rate (bpm): 150 Fundal Height: 32 cm Movement: Present    ? ?General:  Alert, oriented and cooperative. Patient is in no acute distress.  ?Skin: Skin is warm and dry. No rash noted.   ?Cardiovascular: Normal heart rate noted  ?Respiratory: Normal respiratory effort, no problems with respiration noted  ?Abdomen: Soft, gravid, appropriate for gestational age. Pain/Pressure: Absent     ?Pelvic: Vag. Bleeding: None     ?Cervical exam deferred        ?Extremities: Normal range of motion.  Edema: None  ?Mental Status: Normal mood and affect. Normal behavior. Normal judgment and thought content.  ? ? ?Assessment and Plan:  ?Pregnancy: [id number] at [redacted]w[redacted]d ? ?1. Encounter for supervision of high risk multigravida of advanced maternal age, antepartum ?Doing well. Denies any OB concerns. Normal fetal heart tones.  ?- follow up in 2 weeks ? ?2. Language barrier affecting health care ?In person Arabic interpreter used for visit ? ?3. [redacted] weeks gestation of pregnancy ? ?4. Left side sciatica ?Continue stretches and ice.  ?Offered PT- defers at this  time ? ?5. Contraception counseling ?Considering IUD. Not sure but thinks wants it at 6 wks PP or immediately. Leaning towards 6 wk PP. ? ? ?Preterm labor symptoms and general obstetric precautions including but not limited to vaginal bleeding, contractions, leaking of fluid and fetal movement were reviewed in detail with the patient. ?Please refer to After Visit Summary for other counseling recommendations.  ?Return in about 2 weeks (around 05/29/2022) for LROB any provider. ? ?Warner Mccreedy, MD, MPH ?OB Fellow, [facility] ? ? ?

## 2022-05-15 NOTE — Progress Notes (Signed)
Pt in office for ROB visit with no concerns at this time.  ?

## 2022-05-29 ENCOUNTER — Ambulatory Visit (INDEPENDENT_AMBULATORY_CARE_PROVIDER_SITE_OTHER): Payer: Medicaid Other | Admitting: Obstetrics & Gynecology

## 2022-05-29 ENCOUNTER — Encounter: Payer: Self-pay | Admitting: Obstetrics & Gynecology

## 2022-05-29 VITALS — BP 100/67 | HR 87 | Wt 174.9 lb

## 2022-05-29 DIAGNOSIS — Z789 Other specified health status: Secondary | ICD-10-CM

## 2022-05-29 DIAGNOSIS — O09529 Supervision of elderly multigravida, unspecified trimester: Secondary | ICD-10-CM

## 2022-05-29 DIAGNOSIS — Z3A35 35 weeks gestation of pregnancy: Secondary | ICD-10-CM

## 2022-05-29 DIAGNOSIS — O0930 Supervision of pregnancy with insufficient antenatal care, unspecified trimester: Secondary | ICD-10-CM

## 2022-05-29 NOTE — Progress Notes (Signed)
   PRENATAL VISIT NOTE  Subjective:  Allison Holmes is a 36 y.o. G3P2002 at [redacted]w[redacted]d being seen today for ongoing prenatal care.  She is currently monitored for the following issues for this high-risk pregnancy and has Language barrier affecting health care; Constipation; Encounter for supervision of high risk multigravida of advanced maternal age, antepartum; and Late prenatal care on their problem list.  Patient reports  chronic skin cysts/boils for many years, none active. Can't sleep more than 5 hrs at a time .  Contractions: Not present. Vag. Bleeding: None.  Movement: Present. Denies leaking of fluid.   The following portions of the patient's history were reviewed and updated as appropriate: allergies, current medications, past family history, past medical history, past social history, past surgical history and problem list.   Objective:   Vitals:   05/29/22 1130  BP: 100/67  Pulse: 87  Weight: 174 lb 14.4 oz (79.3 kg)    Fetal Status: Fetal Heart Rate (bpm): 154   Movement: Present     General:  Alert, oriented and cooperative. Patient is in no acute distress.  Skin: Skin is warm and dry. No rash noted.   Cardiovascular: Normal heart rate noted  Respiratory: Normal respiratory effort, no problems with respiration noted  Abdomen: Soft, gravid, appropriate for gestational age.  Pain/Pressure: Absent     Pelvic: Cervical exam deferred        Extremities: Normal range of motion.  Edema: None  Mental Status: Normal mood and affect. Normal behavior. Normal judgment and thought content.   Assessment and Plan:  Pregnancy: G3P2002 at [redacted]w[redacted]d 1. Late prenatal care   2. Encounter for supervision of high risk multigravida of advanced maternal age, antepartum Appropriate growth. 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 Language barrier affecting health care Arabic interpreter. Understands some English  Preterm labor symptoms and general obstetric precautions including but not limited to  vaginal bleeding, contractions, leaking of fluid and fetal movement were reviewed in detail with the patient. Please refer to After Visit Summary for other counseling recommendations.   Return in about 1 week (around 06/05/2022).  No future appointments.  Emeterio Reeve, MD

## 2022-05-29 NOTE — Progress Notes (Signed)
Pt presents for ROB visit. Pt c/o of insomnia and states she's only sleeping 5 hours a day and is requesting something to help her sleep.

## 2022-05-31 DIAGNOSIS — Z419 Encounter for procedure for purposes other than remedying health state, unspecified: Secondary | ICD-10-CM | POA: Diagnosis not present

## 2022-06-04 ENCOUNTER — Other Ambulatory Visit (HOSPITAL_COMMUNITY)
Admission: RE | Admit: 2022-06-04 | Discharge: 2022-06-04 | Disposition: A | Discharge status: Home / Self Care | Payer: Medicaid Other | Source: Ambulatory Visit | Attending: Obstetrics and Gynecology | Admitting: Obstetrics and Gynecology

## 2022-06-04 ENCOUNTER — Ambulatory Visit (INDEPENDENT_AMBULATORY_CARE_PROVIDER_SITE_OTHER): Payer: Medicaid Other | Admitting: Obstetrics and Gynecology

## 2022-06-04 ENCOUNTER — Encounter: Payer: Self-pay | Admitting: Obstetrics and Gynecology

## 2022-06-04 VITALS — BP 97/63 | HR 89 | Wt 176.0 lb

## 2022-06-04 DIAGNOSIS — O09529 Supervision of elderly multigravida, unspecified trimester: Secondary | ICD-10-CM

## 2022-06-04 DIAGNOSIS — Z789 Other specified health status: Secondary | ICD-10-CM

## 2022-06-04 DIAGNOSIS — Z3A36 36 weeks gestation of pregnancy: Secondary | ICD-10-CM

## 2022-06-04 DIAGNOSIS — G4709 Other insomnia: Secondary | ICD-10-CM

## 2022-06-04 NOTE — Progress Notes (Signed)
Pt presents for ROB visit.  

## 2022-06-04 NOTE — Progress Notes (Signed)
   LOW-RISK PREGNANCY OFFICE VISIT Patient name: Allison Holmes MRN [id number]  Date of birth: 04-19-1986 Chief Complaint:   Routine Prenatal Visit  History of Present Illness:   Allison Holmes is a 36 y.o. G38P2002 female at [redacted]w[redacted]d with an Estimated Date of Delivery: 07/01/22 being seen today for ongoing management of a low-risk pregnancy.  Today she reports no complaints. She is concerned that the baby is not a female as stated by U/S. She had Panorama, but does not know the results. She wants to make sure the Panorama test and the U/S have the same results; female. Contractions: Irritability. Vag. Bleeding: None.  Movement: Present. denies leaking of fluid. Review of Systems:   Pertinent items are noted in HPI Denies abnormal vaginal discharge w/ itching/odor/irritation, headaches, visual changes, shortness of breath, chest pain, abdominal pain, severe nausea/vomiting, or problems with urination or bowel movements unless otherwise stated above. Pertinent History Reviewed:  Reviewed past medical,surgical, social, obstetrical and family history.  Reviewed problem list, medications and allergies. Physical Assessment:   Vitals:   06/04/22 1504  BP: 97/63  Pulse: 89  Weight: 176 lb (79.8 kg)  Body mass index is 27.57 kg/m.        Physical Examination:   General appearance: Well appearing, and in no distress  Mental status: Alert, oriented to person, place, and time  Skin: Warm & dry  Cardiovascular: Normal heart rate noted  Respiratory: Normal respiratory effort, no distress  Abdomen: Soft, gravid, nontender  Pelvic: Cervical exam performed for collection of GBS and GC/CT only - cervical check declined by patient        Extremities: Edema: None  Fetal Status: Fetal Heart Rate (bpm): 148 Fundal Height: 35 cm Movement: Present Presentation: Vertex  No results found for this or any previous visit (from the past 24 hour(s)).  Assessment & Plan:  1) Low-risk pregnancy G3P2002 at [redacted]w[redacted]d with  an Estimated Date of Delivery: 07/01/22   2) Encounter for supervision of high risk multigravida of advanced maternal age, antepartum - Confirmed results of Panorama and U/S are both female - patient informed and delighted by this - GBS Culture - GC/CT  3) Language barrier affecting health care - [facility] In-Person Arabic Interpreter, Maralyn Sago used for entire visit   4) Other insomnia  - Patient reports sleeping "a little better" this week; getting about 5-6 hours of sleep - Reassurance given that sleeping in short time periods at this gestation is normal for this gestation of pregnancy - Advised this is her body's way of preparing her for the sleepless/short time periods of sleeping once the baby is born  5) [redacted] weeks gestation of pregnancy   Meds: No orders of the defined types were placed in this encounter.  Labs/procedures today: GSB and GC/CT -- Results pending   Plan:  Continue routine obstetrical care   Reviewed: Preterm labor symptoms and general obstetric precautions including but not limited to vaginal bleeding, contractions, leaking of fluid and fetal movement were reviewed in detail with the patient.  All questions were answered. Has home bp cuff. Check bp weekly, let us know if >140/90.   Follow-up: Return in about 1 week (around 06/11/2022) for Return OB visit.  No orders of the defined types were placed in this encounter.  Raelyn Mora MSN, CNM 06/04/2022 3:18 PM

## 2022-06-05 LAB — CERVICOVAGINAL ANCILLARY ONLY
Chlamydia: NEGATIVE
Comment: NEGATIVE
Comment: NORMAL
Neisseria Gonorrhea: NEGATIVE

## 2022-06-08 LAB — CULTURE, BETA STREP (GROUP B ONLY): Strep Gp B Culture: NEGATIVE

## 2022-06-12 ENCOUNTER — Ambulatory Visit (INDEPENDENT_AMBULATORY_CARE_PROVIDER_SITE_OTHER): Payer: Medicaid Other | Admitting: Obstetrics & Gynecology

## 2022-06-12 VITALS — BP 106/67 | HR 92 | Wt 176.0 lb

## 2022-06-12 DIAGNOSIS — O09529 Supervision of elderly multigravida, unspecified trimester: Secondary | ICD-10-CM

## 2022-06-12 DIAGNOSIS — Z603 Acculturation difficulty: Secondary | ICD-10-CM

## 2022-06-12 DIAGNOSIS — Z789 Other specified health status: Secondary | ICD-10-CM

## 2022-06-12 DIAGNOSIS — O0930 Supervision of pregnancy with insufficient antenatal care, unspecified trimester: Secondary | ICD-10-CM

## 2022-06-12 NOTE — Progress Notes (Signed)
   PRENATAL VISIT NOTE  Subjective:  Allison Holmes is a 36 y.o. G3P2002 at [redacted]w[redacted]d being seen today for ongoing prenatal care.  She is currently monitored for the following issues for this low-risk pregnancy and has Language barrier affecting health care; Constipation; Encounter for supervision of high risk multigravida of advanced maternal age, antepartum; and Late prenatal care on their problem list.  Patient reports no complaints.  Contractions: Not present. Vag. Bleeding: None.  Movement: Present. Denies leaking of fluid.   The following portions of the patient's history were reviewed and updated as appropriate: allergies, current medications, past family history, past medical history, past social history, past surgical history and problem list.   Objective:   Vitals:   06/12/22 1051  BP: 106/67  Pulse: 92  Weight: 176 lb (79.8 kg)    Fetal Status: Fetal Heart Rate (bpm): 150   Movement: Present     General:  Alert, oriented and cooperative. Patient is in no acute distress.  Skin: Skin is warm and dry. No rash noted.   Cardiovascular: Normal heart rate noted  Respiratory: Normal respiratory effort, no problems with respiration noted  Abdomen: Soft, gravid, appropriate for gestational age.  Pain/Pressure: Absent     Pelvic: Cervical exam deferred        Extremities: Normal range of motion.     Mental Status: Normal mood and affect. Normal behavior. Normal judgment and thought content.   Assessment and Plan:  Pregnancy: G3P2002 at [redacted]w[redacted]d There are no diagnoses linked to this encounter. Term labor symptoms and general obstetric precautions including but not limited to vaginal bleeding, contractions, leaking of fluid and fetal movement were reviewed in detail with the patient. Please refer to After Visit Summary for other counseling recommendations.   Return in about 1 week (around 06/19/2022).  Future Appointments  Date Time Provider Department Center  06/18/2022  8:35 AM Warner Mccreedy, MD [facility] None  06/25/2022  9:35 AM Leftwich-Kirby, Wilmer Floor, CNM [facility] None    Scheryl Darter, MD

## 2022-06-17 NOTE — Progress Notes (Unsigned)
  Subjective:  Allison Holmes is a 36 y.o. G3P2002 at [redacted]w[redacted]d being seen today for ongoing prenatal care.  She is currently monitored for the following issues for this {Blank single:19197::"high-risk","low-risk"} pregnancy and has Language barrier affecting health care; Constipation; Encounter for supervision of high risk multigravida of advanced maternal age, antepartum; and Late prenatal care on their problem list.  Patient reports {sx:14538}.   .  .   . Denies leaking of fluid.   The following portions of the patient's history were reviewed and updated as appropriate: allergies, current medications, past family history, past medical history, past social history, past surgical history and problem list. Problem list updated.  Objective:  There were no vitals filed for this visit.  Fetal Status:           General:  Alert, oriented and cooperative. Patient is in no acute distress.  Skin: Skin is warm and dry. No rash noted.   Cardiovascular: Normal heart rate noted  Respiratory: Normal respiratory effort, no problems with respiration noted  Abdomen: Soft, gravid, appropriate for gestational age.       Pelvic:       {Blank single:19197::"Cervical exam performed","Cervical exam deferred"}        Extremities: Normal range of motion.     Mental Status: Normal mood and affect. Normal behavior. Normal judgment and thought content.   Urinalysis:      Assessment and Plan:  Pregnancy: G3P2002 at [redacted]w[redacted]d  1. Encounter for supervision of high risk multigravida of advanced maternal age, antepartum ***Doing well. No acute concerns. Heart tones and fundal height appropriate - f/up in 1 week - Started to discuss delivery planning as listed below  2. Language barrier affecting health care ***Arabic interpreter present  3. [redacted] weeks gestation of pregnancy ***Discussed delivery planning with patient. Discussed if not had delivery by 39-40 weeks would recommend delivery/IOL. Patient expresses  understanding  Term labor symptoms and general obstetric precautions including but not limited to vaginal bleeding, contractions, leaking of fluid and fetal movement were reviewed in detail with the patient. Please refer to After Visit Summary for other counseling recommendations.  No follow-ups on file.  Warner Mccreedy, MD, MPH OB Fellow, Faculty Practice

## 2022-06-18 ENCOUNTER — Ambulatory Visit: Payer: Medicaid Other | Admitting: Family Medicine

## 2022-06-18 DIAGNOSIS — Z3A38 38 weeks gestation of pregnancy: Secondary | ICD-10-CM

## 2022-06-18 DIAGNOSIS — O09529 Supervision of elderly multigravida, unspecified trimester: Secondary | ICD-10-CM

## 2022-06-18 DIAGNOSIS — Z789 Other specified health status: Secondary | ICD-10-CM

## 2022-06-25 ENCOUNTER — Ambulatory Visit (INDEPENDENT_AMBULATORY_CARE_PROVIDER_SITE_OTHER): Payer: Medicaid Other | Admitting: Advanced Practice Midwife

## 2022-06-25 VITALS — BP 109/73 | HR 88 | Wt 177.2 lb

## 2022-06-25 DIAGNOSIS — O0930 Supervision of pregnancy with insufficient antenatal care, unspecified trimester: Secondary | ICD-10-CM

## 2022-06-25 DIAGNOSIS — Z3A39 39 weeks gestation of pregnancy: Secondary | ICD-10-CM

## 2022-06-25 DIAGNOSIS — O09529 Supervision of elderly multigravida, unspecified trimester: Secondary | ICD-10-CM

## 2022-06-25 DIAGNOSIS — Z789 Other specified health status: Secondary | ICD-10-CM

## 2022-06-26 ENCOUNTER — Telehealth (HOSPITAL_COMMUNITY): Payer: Self-pay | Admitting: *Deleted

## 2022-06-27 ENCOUNTER — Encounter (HOSPITAL_COMMUNITY): Payer: Self-pay | Admitting: *Deleted

## 2022-06-27 ENCOUNTER — Telehealth (HOSPITAL_COMMUNITY): Payer: Self-pay | Admitting: *Deleted

## 2022-06-27 NOTE — Telephone Encounter (Signed)
Preadmission screen  Interpreter number [phone]

## 2022-06-30 DIAGNOSIS — Z419 Encounter for procedure for purposes other than remedying health state, unspecified: Secondary | ICD-10-CM | POA: Diagnosis not present

## 2022-07-03 ENCOUNTER — Inpatient Hospital Stay (HOSPITAL_COMMUNITY): Payer: Medicaid Other | Admitting: Anesthesiology

## 2022-07-03 ENCOUNTER — Other Ambulatory Visit: Payer: Self-pay

## 2022-07-03 ENCOUNTER — Encounter (HOSPITAL_COMMUNITY): Payer: Self-pay | Admitting: Obstetrics & Gynecology

## 2022-07-03 ENCOUNTER — Inpatient Hospital Stay (HOSPITAL_COMMUNITY)
Admission: AD | Admit: 2022-07-03 | Discharge: 2022-07-05 | DRG: 807 | Disposition: A | Discharge status: Home / Self Care | Payer: Medicaid Other | Attending: Obstetrics & Gynecology | Admitting: Obstetrics & Gynecology

## 2022-07-03 DIAGNOSIS — Z3A40 40 weeks gestation of pregnancy: Secondary | ICD-10-CM | POA: Diagnosis not present

## 2022-07-03 DIAGNOSIS — Z603 Acculturation difficulty: Secondary | ICD-10-CM | POA: Diagnosis not present

## 2022-07-03 DIAGNOSIS — O480 Post-term pregnancy: Secondary | ICD-10-CM | POA: Diagnosis not present

## 2022-07-03 DIAGNOSIS — O368130 Decreased fetal movements, third trimester, not applicable or unspecified: Secondary | ICD-10-CM | POA: Diagnosis not present

## 2022-07-03 DIAGNOSIS — O368131 Decreased fetal movements, third trimester, fetus 1: Secondary | ICD-10-CM | POA: Diagnosis not present

## 2022-07-03 DIAGNOSIS — O368190 Decreased fetal movements, unspecified trimester, not applicable or unspecified: Secondary | ICD-10-CM | POA: Diagnosis present

## 2022-07-03 DIAGNOSIS — O0930 Supervision of pregnancy with insufficient antenatal care, unspecified trimester: Secondary | ICD-10-CM

## 2022-07-03 DIAGNOSIS — O09529 Supervision of elderly multigravida, unspecified trimester: Secondary | ICD-10-CM

## 2022-07-03 DIAGNOSIS — Z789 Other specified health status: Secondary | ICD-10-CM | POA: Diagnosis present

## 2022-07-03 LAB — TYPE AND SCREEN
ABO/RH(D): B POS
Antibody Screen: NEGATIVE

## 2022-07-03 LAB — CBC
HCT: 37.6 % (ref 36.0–46.0)
Hemoglobin: 12.5 g/dL (ref 12.0–15.0)
MCH: 27.7 pg (ref 26.0–34.0)
MCHC: 33.2 g/dL (ref 30.0–36.0)
MCV: 83.2 fL (ref 80.0–100.0)
Platelets: 218 10*3/uL (ref 150–400)
RBC: 4.52 MIL/uL (ref 3.87–5.11)
RDW: 13.2 % (ref 11.5–15.5)
WBC: 9.8 10*3/uL (ref 4.0–10.5)
nRBC: 0 % (ref 0.0–0.2)

## 2022-07-03 LAB — URINALYSIS, ROUTINE W REFLEX MICROSCOPIC
Bilirubin Urine: NEGATIVE
Glucose, UA: NEGATIVE mg/dL
Hgb urine dipstick: NEGATIVE
Ketones, ur: NEGATIVE mg/dL
Nitrite: NEGATIVE
Protein, ur: NEGATIVE mg/dL
Specific Gravity, Urine: 1.017 (ref 1.005–1.030)
pH: 6 (ref 5.0–8.0)

## 2022-07-03 MED ORDER — LACTATED RINGERS IV SOLN
500.0000 mL | INTRAVENOUS | Status: DC | PRN
  Administered 2022-07-04 (×2): 500 mL via INTRAVENOUS

## 2022-07-03 MED ORDER — PHENYLEPHRINE 80 MCG/ML (10ML) SYRINGE FOR IV PUSH (FOR BLOOD PRESSURE SUPPORT): 80.0000 ug | PREFILLED_SYRINGE | INTRAVENOUS | Status: DC | PRN

## 2022-07-03 MED ORDER — LACTATED RINGERS IV SOLN
500.0000 mL | Freq: Once | INTRAVENOUS | Status: AC
  Administered 2022-07-03: 500 mL via INTRAVENOUS

## 2022-07-03 MED ORDER — OXYCODONE-ACETAMINOPHEN 5-325 MG PO TABS: 1.0000 | ORAL_TABLET | ORAL | Status: DC | PRN

## 2022-07-03 MED ORDER — MISOPROSTOL 50MCG HALF TABLET: 50.0000 ug | ORAL_TABLET | ORAL | Status: DC

## 2022-07-03 MED ORDER — ACETAMINOPHEN 325 MG PO TABS: 650.0000 mg | ORAL_TABLET | ORAL | Status: DC | PRN

## 2022-07-03 MED ORDER — EPHEDRINE 5 MG/ML INJ: 10.0000 mg | INTRAVENOUS | Status: DC | PRN

## 2022-07-03 MED ORDER — OXYCODONE-ACETAMINOPHEN 5-325 MG PO TABS: 2.0000 | ORAL_TABLET | ORAL | Status: DC | PRN

## 2022-07-03 MED ORDER — PHENYLEPHRINE 80 MCG/ML (10ML) SYRINGE FOR IV PUSH (FOR BLOOD PRESSURE SUPPORT)
80.0000 ug | PREFILLED_SYRINGE | INTRAVENOUS | Status: DC | PRN
  Filled 2022-07-03: qty 10

## 2022-07-03 MED ORDER — SOD CITRATE-CITRIC ACID 500-334 MG/5ML PO SOLN: 30.0000 mL | ORAL | Status: DC | PRN

## 2022-07-03 MED ORDER — FENTANYL-BUPIVACAINE-NACL 0.5-0.125-0.9 MG/250ML-% EP SOLN
EPIDURAL | Status: DC | PRN
  Administered 2022-07-03: 12 mL/h via EPIDURAL

## 2022-07-03 MED ORDER — DIPHENHYDRAMINE HCL 50 MG/ML IJ SOLN: 12.5000 mg | INTRAMUSCULAR | Status: DC | PRN

## 2022-07-03 MED ORDER — OXYTOCIN-SODIUM CHLORIDE 30-0.9 UT/500ML-% IV SOLN
2.5000 [IU]/h | INTRAVENOUS | Status: DC
  Filled 2022-07-03: qty 500

## 2022-07-03 MED ORDER — OXYTOCIN BOLUS FROM INFUSION
333.0000 mL | Freq: Once | INTRAVENOUS | Status: AC
  Administered 2022-07-04: 333 mL via INTRAVENOUS

## 2022-07-03 MED ORDER — LACTATED RINGERS IV BOLUS
1000.0000 mL | Freq: Once | INTRAVENOUS | Status: AC
  Administered 2022-07-03: 1000 mL via INTRAVENOUS

## 2022-07-03 MED ORDER — LACTATED RINGERS IV SOLN: INTRAVENOUS | Status: DC

## 2022-07-03 MED ORDER — TERBUTALINE SULFATE 1 MG/ML IJ SOLN: 0.2500 mg | Freq: Once | INTRAMUSCULAR | Status: DC | PRN

## 2022-07-03 MED ORDER — LIDOCAINE HCL (PF) 1 % IJ SOLN: 30.0000 mL | INTRAMUSCULAR | Status: DC | PRN

## 2022-07-03 MED ORDER — ONDANSETRON HCL 4 MG/2ML IJ SOLN: 4.0000 mg | Freq: Four times a day (QID) | INTRAMUSCULAR | Status: DC | PRN

## 2022-07-03 MED ORDER — LIDOCAINE HCL (PF) 1 % IJ SOLN
INTRAMUSCULAR | Status: DC | PRN
  Administered 2022-07-03: 5 mL via EPIDURAL

## 2022-07-03 MED ORDER — FENTANYL CITRATE (PF) 100 MCG/2ML IJ SOLN: 100.0000 ug | INTRAMUSCULAR | Status: DC | PRN

## 2022-07-03 MED ORDER — FENTANYL-BUPIVACAINE-NACL 0.5-0.125-0.9 MG/250ML-% EP SOLN
12.0000 mL/h | EPIDURAL | Status: DC | PRN
  Filled 2022-07-03: qty 250

## 2022-07-03 MED ORDER — OXYTOCIN-SODIUM CHLORIDE 30-0.9 UT/500ML-% IV SOLN
1.0000 m[IU]/min | INTRAVENOUS | Status: DC
  Administered 2022-07-03: 2 m[IU]/min via INTRAVENOUS

## 2022-07-03 NOTE — Anesthesia Preprocedure Evaluation (Addendum)
Anesthesia Evaluation  Patient identified by MRN, date of birth, ID band Patient awake    Reviewed: Allergy & Precautions, NPO status , Patient's Chart, lab work & pertinent test results  Airway Mallampati: II  TM Distance: >3 FB Neck ROM: Full    Dental no notable dental hx. (+) Teeth Intact, Dental Advisory Given   Pulmonary neg pulmonary ROS,    Pulmonary exam normal breath sounds clear to auscultation       Cardiovascular Exercise Tolerance: Good Normal cardiovascular exam Rhythm:Regular Rate:Normal     Neuro/Psych negative neurological ROS  negative psych ROS   GI/Hepatic negative GI ROS, Neg liver ROS,   Endo/Other  negative endocrine ROS  Renal/GU negative Renal ROS     Musculoskeletal   Abdominal   Peds  Hematology Lab Results      Component                Value               Date                      WBC                      9.8                 07/03/2022                HGB                      12.5                07/03/2022                HCT                      37.6                07/03/2022                MCV                      83.2                07/03/2022                PLT                      218                 07/03/2022              Anesthesia Other Findings   Reproductive/Obstetrics (+) Pregnancy                            Anesthesia Physical Anesthesia Plan  ASA: 2  Anesthesia Plan: Epidural   Post-op Pain Management:    Induction:   PONV Risk Score and Plan: Treatment may vary due to age or medical condition  Airway Management Planned: Natural Airway and Nasal Cannula  Additional Equipment: None  Intra-op Plan:   Post-operative Plan:   Informed Consent: I have reviewed the patients History and Physical, chart, labs and discussed the procedure including the risks, benefits and alternatives for the proposed anesthesia with the patient or authorized  representative who has indicated his/her understanding and acceptance.  Interpreter used for [employer] Discussed with:   Anesthesia Plan Comments: (40.2 wk G3P2 Arabic Speaking patient for LEA)       Anesthesia Quick Evaluation

## 2022-07-03 NOTE — Progress Notes (Signed)
Labor Progress Note Allison Holmes is a 36 y.o. G3P2002 at [redacted]w[redacted]d presented for IOL d/t DFM.   S: Feeling comfortable s/p epidural placement. FB just came out.   O:  BP 116/66   Pulse 78   Temp 98.2 F (36.8 C) (Oral)   Resp 18   Ht 5' 7.72" (1.72 m)   Wt 81.3 kg   LMP 09/16/2021   SpO2 97% Comment: room air  BMI 27.48 kg/m  EFM: 160/mod/15x15/none   CVE: Dilation: 5.5 Effacement (%): 80 Station: -2 Presentation: Vertex Exam by:: Leticia Penna, DO   A&P: 36 y.o. [id number] [redacted]w[redacted]d  #Labor: Progressing well now s/p FB. Offered AROM at this time, but patient would prefer more time with pit only and reassess in a few hours.  #Pain: Epidural in place  #FWB: FHT appears to be more tachycardia (baseline ranging around 160-180's) after recent epidural placement, but overall reassuring tracing. Will give bolus and reassess. No maternal fever and does not feel warm.  #GBS negative  Allayne Stack, DO 11:22 PM

## 2022-07-03 NOTE — MAU Note (Signed)
Allison Holmes is a 36 y.o. at [redacted]w[redacted]d here in MAU reporting: has been having contractions every 1-1.5 hours and is having DFM. No bleeding or LOF.    Onset of complaint: today  Pain score: 5/10  Vitals:   07/03/22 1745  BP: 109/67  Pulse: 96  Resp: 16  Temp: 98.4 F (36.9 C)  SpO2: 97%     FHT:164  Lab orders placed from triage: none

## 2022-07-03 NOTE — H&P (Signed)
OBSTETRIC ADMISSION HISTORY AND PHYSICAL  Allison Holmes is a 36 y.o. female G3P2002 with IUP at 36w2dby 19wk U/S presenting for decreased fetal movement all day, continued while in MAU. Endorses contractions every hour or so but no other pain. She reports +FMs, No LOF, no VB, no blurry vision, headaches or peripheral edema, and RUQ pain.  She plans on breast and formula feeding. She request postpartum IUD (at 6wk appt) for birth control. She received her prenatal care at [facility].  Dating: By 19wk U/S --->  Estimated Date of Delivery: 07/01/22 Sono:  _0 , CWD, normal anatomy, cephalic presentation, 22130Q 66% EFW  Prenatal History/Complications: AMA, GBS negative  Past Medical History: Past Medical History:  Diagnosis Date   Medical history non-contributory    Past Surgical History: Past Surgical History:  Procedure Laterality Date   NO PAST SURGERIES     Obstetrical History: OB History     Gravida  3   Para  2   Term  2   Preterm  0   AB  0   Living  2      SAB  0   IAB  0   Ectopic  0   Multiple  0   Live Births  2          Social History Social History   Socioeconomic History   Marital status: Married    Spouse name: Not on file   Number of children: Not on file   Years of education: Not on file   Highest education level: Not on file  Occupational History   Not on file  Tobacco Use   Smoking status: Never   Smokeless tobacco: Never  Vaping Use   Vaping Use: Never used  Substance and Sexual Activity   Alcohol use: No   Drug use: Never   Sexual activity: Yes    Birth control/protection: None  Other Topics Concern   Not on file  Social History Narrative   Not on file   Social Determinants of Health   Financial Resource Strain: Not on file  Food Insecurity: Not on file  Transportation Needs: Not on file  Physical Activity: Not on file  Stress: Not on file  Social Connections: Not on file   Family History: Family History   Problem Relation Age of Onset   Cancer Neg Hx    Diabetes Neg Hx    Hypertension Neg Hx    Allergies: No Known Allergies  Medications Prior to Admission  Medication Sig Dispense Refill Last Dose   famotidine (PEPCID) 20 MG tablet Take 1 tablet (20 mg total) by mouth 2 (two) times daily. 60 tablet 2 07/02/2022   Prenatal-DSS-FeCb-FeGl-FA (CITRANATAL BLOOM) 90-1 MG TABS Take 1 tablet by mouth daily. 30 tablet 11 07/02/2022   Blood Pressure Monitoring (BLOOD PRESSURE KIT) DEVI 1 kit by Does not apply route once a week. 1 each 0    Review of Systems  All systems reviewed and negative except as stated in HPI  Blood pressure 109/67, pulse 96, temperature 98.4 F (36.9 C), temperature source Oral, resp. rate 16, weight 179 lb 3.2 oz (81.3 kg), last menstrual period 09/16/2021, SpO2 97 %, currently breastfeeding. General appearance: alert, cooperative, appears stated age, and no distress Lungs: clear to auscultation bilaterally Heart: regular rate and rhythm Abdomen: soft, non-tender; bowel sounds normal Pelvic: normal external female genitalia Extremities: Homans sign is negative, no sign of DVT DTR's normal Presentation: cephalic Fetal monitoring: Baseline: 155 bpm, Variability: Fair (1-6  bpm), Accelerations: Reactive, and Decelerations: Absent Uterine activity: Date/time of onset: this morning upon waking, Frequency: 1-2 times per hour, and Intensity: mild Dilation: 1.5 Effacement (%): 50 Station: Ballotable Exam by:: Gaylan Gerold, CNM  Prenatal labs: ABO, Rh: --/--/PENDING (07/04 1757) Antibody: PENDING (07/04 1757) Rubella: 24.30 (02/02 1711) RPR: Non Reactive (03/31 1034)  HBsAg: Negative (02/02 1711)  HIV: Non Reactive (03/31 1034)  GBS: Negative/-- (06/05 1606)  2hr Glucola: 73/111/99 (normal) Genetic screening: low risk, normal Anatomy U/S: normal  Prenatal Transfer Tool  Maternal Diabetes: No Genetic Screening: Normal Maternal Ultrasounds/Referrals: Normal Fetal  Ultrasounds or other Referrals:  Referred to Materal Fetal Medicine  Maternal Substance Abuse:  No Significant Maternal Medications:  None Significant Maternal Lab Results: Group B Strep negative  Results for orders placed or performed during the hospital encounter of 07/03/22 (from the past 24 hour(s))  Type and screen   Collection Time: 07/03/22  5:57 PM  Result Value Ref Range   ABO/RH(D) PENDING    Antibody Screen PENDING    Sample Expiration      07/06/2022,2359 Performed at [facility], [place] [address] [place], [place] [address]    Patient Active Problem List   Diagnosis Date Noted   Post term pregnancy at [redacted] weeks gestation 07/03/2022   Late prenatal care 02/01/2022   Encounter for supervision of high risk multigravida of advanced maternal age, antepartum 01/22/2022   Constipation 07/10/2018   Language barrier affecting health care 03/22/2018   Assessment/Plan:  Allison Holmes is a 36 y.o. G3P2002 at 13w2dhere for decreased fetal movement and initially non-reassuring NST (tachycardic and minimal variability) which improved with a 1L LR bolus  #Labor: IOL for DFM at post-dates - FB placed in MAU, pt to ambulate upstairs then labor team will reevaluate fetal status and begin cytotec around 2000. #Pain: Planning epidural #FWB: Now Cat 1 #ID:  GBS neg #MOF: Breast and formula #MOC: Interval IUD #Circ:  N/A  JGabriel Carina CNM  07/03/2022, 6:24 PM

## 2022-07-03 NOTE — Anesthesia Procedure Notes (Signed)
Epidural Patient location during procedure: OB Start time: 07/03/2022 10:21 PM End time: 07/03/2022 10:39 PM  Staffing Anesthesiologist: Trevor Iha, MD Performed: anesthesiologist   Preanesthetic Checklist Completed: patient identified, IV checked, site marked, risks and benefits discussed, surgical consent, monitors and equipment checked, pre-op evaluation and timeout performed  Epidural Patient position: sitting Prep: DuraPrep and site prepped and draped Patient monitoring: continuous pulse ox and blood pressure Approach: midline Location: L3-L4 Injection technique: LOR air  Needle:  Needle type: Tuohy  Needle gauge: 17 G Needle length: 9 cm and 9 Needle insertion depth: 6 cm Catheter type: closed end flexible Catheter size: 19 Gauge Catheter at skin depth: 11 cm Test dose: negative  Assessment Events: blood not aspirated, injection not painful, no injection resistance, no paresthesia and negative IV test  Additional Notes Patient identified. Risks/Benefits/Options discussed with patient including but not limited to bleeding, infection, nerve damage, paralysis, failed block, incomplete pain control, headache, blood pressure changes, nausea, vomiting, reactions to medication both or allergic, itching and postpartum back pain. Confirmed with bedside nurse the patient's most recent platelet count. Confirmed with patient that they are not currently taking any anticoagulation, have any bleeding history or any family history of bleeding disorders. Patient expressed understanding and wished to proceed. All questions were answered. Sterile technique was used throughout the entire procedure. Please see nursing notes for vital signs. Test dose was given through epidural needle and negative prior to continuing to dose epidural or start infusion. Warning signs of high block given to the patient including shortness of breath, tingling/numbness in hands, complete motor block, or any  concerning symptoms with instructions to call for help. Patient was given instructions on fall risk and not to get out of bed. All questions and concerns addressed with instructions to call with any issues.  1 Attempt (S) . Patient tolerated procedure well.

## 2022-07-04 ENCOUNTER — Encounter (HOSPITAL_COMMUNITY): Payer: Self-pay | Admitting: Family Medicine

## 2022-07-04 DIAGNOSIS — O368131 Decreased fetal movements, third trimester, fetus 1: Secondary | ICD-10-CM | POA: Diagnosis not present

## 2022-07-04 DIAGNOSIS — Z3A40 40 weeks gestation of pregnancy: Secondary | ICD-10-CM | POA: Diagnosis not present

## 2022-07-04 DIAGNOSIS — O480 Post-term pregnancy: Secondary | ICD-10-CM | POA: Diagnosis not present

## 2022-07-04 DIAGNOSIS — O368190 Decreased fetal movements, unspecified trimester, not applicable or unspecified: Secondary | ICD-10-CM | POA: Diagnosis present

## 2022-07-04 LAB — RPR: RPR Ser Ql: NONREACTIVE

## 2022-07-04 MED ORDER — TETANUS-DIPHTH-ACELL PERTUSSIS 5-2.5-18.5 LF-MCG/0.5 IM SUSY: 0.5000 mL | PREFILLED_SYRINGE | Freq: Once | INTRAMUSCULAR | Status: DC

## 2022-07-04 MED ORDER — ONDANSETRON HCL 4 MG PO TABS: 4.0000 mg | ORAL_TABLET | ORAL | Status: DC | PRN

## 2022-07-04 MED ORDER — BENZOCAINE-MENTHOL 20-0.5 % EX AERO: 1.0000 | INHALATION_SPRAY | CUTANEOUS | Status: DC | PRN

## 2022-07-04 MED ORDER — SENNOSIDES-DOCUSATE SODIUM 8.6-50 MG PO TABS
2.0000 | ORAL_TABLET | ORAL | Status: DC
Start: 2022-07-04 — End: 2022-07-05
  Administered 2022-07-04 – 2022-07-05 (×2): 2 via ORAL
  Filled 2022-07-04 (×2): qty 2

## 2022-07-04 MED ORDER — COCONUT OIL OIL: 1.0000 | TOPICAL_OIL | Status: DC | PRN

## 2022-07-04 MED ORDER — ONDANSETRON HCL 4 MG/2ML IJ SOLN: 4.0000 mg | INTRAMUSCULAR | Status: DC | PRN

## 2022-07-04 MED ORDER — SODIUM CHLORIDE 0.9% FLUSH: 3.0000 mL | Freq: Two times a day (BID) | INTRAVENOUS | Status: DC

## 2022-07-04 MED ORDER — PRENATAL MULTIVITAMIN CH
1.0000 | ORAL_TABLET | Freq: Every day | ORAL | Status: DC
  Administered 2022-07-04 – 2022-07-05 (×2): 1 via ORAL
  Filled 2022-07-04 (×2): qty 1

## 2022-07-04 MED ORDER — IBUPROFEN 600 MG PO TABS
600.0000 mg | ORAL_TABLET | Freq: Four times a day (QID) | ORAL | Status: DC
  Administered 2022-07-04 – 2022-07-05 (×6): 600 mg via ORAL
  Filled 2022-07-04 (×6): qty 1

## 2022-07-04 MED ORDER — SODIUM CHLORIDE 0.9% FLUSH: 3.0000 mL | INTRAVENOUS | Status: DC | PRN

## 2022-07-04 MED ORDER — SODIUM CHLORIDE 0.9 % IV SOLN: INTRAVENOUS | Status: DC | PRN

## 2022-07-04 MED ORDER — WITCH HAZEL-GLYCERIN EX PADS: 1.0000 | MEDICATED_PAD | CUTANEOUS | Status: DC | PRN

## 2022-07-04 MED ORDER — ZOLPIDEM TARTRATE 5 MG PO TABS: 5.0000 mg | ORAL_TABLET | Freq: Every evening | ORAL | Status: DC | PRN

## 2022-07-04 MED ORDER — DIBUCAINE (PERIANAL) 1 % EX OINT: 1.0000 | TOPICAL_OINTMENT | CUTANEOUS | Status: DC | PRN

## 2022-07-04 MED ORDER — SIMETHICONE 80 MG PO CHEW: 80.0000 mg | CHEWABLE_TABLET | ORAL | Status: DC | PRN

## 2022-07-04 MED ORDER — DIPHENHYDRAMINE HCL 25 MG PO CAPS: 25.0000 mg | ORAL_CAPSULE | Freq: Four times a day (QID) | ORAL | Status: DC | PRN

## 2022-07-04 MED ORDER — ACETAMINOPHEN 325 MG PO TABS
650.0000 mg | ORAL_TABLET | ORAL | Status: DC | PRN
  Administered 2022-07-04: 650 mg via ORAL
  Filled 2022-07-04: qty 2

## 2022-07-04 NOTE — Anesthesia Postprocedure Evaluation (Signed)
Anesthesia Post Note  Patient: Allison Holmes  Procedure(s) Performed: AN AD HOC LABOR EPIDURAL     Patient location during evaluation: Mother Baby Anesthesia Type: Epidural Level of consciousness: awake and alert Pain management: pain level controlled Vital Signs Assessment: post-procedure vital signs reviewed and stable Respiratory status: spontaneous breathing, nonlabored ventilation and respiratory function stable Cardiovascular status: stable Postop Assessment: no headache, no backache and epidural receding Anesthetic complications: no   No notable events documented.  Last Vitals:  Vitals:   07/04/22 1749 07/04/22 2027  BP: 113/79 109/63  Pulse: 78 65  Resp: 16   Temp: 36.7 C   SpO2:      Last Pain:  Vitals:   07/04/22 2025  TempSrc:   PainSc: 3                  Temperence Zenor

## 2022-07-04 NOTE — Lactation Note (Signed)
This note was copied from a baby's chart. Lactation Consultation Note Used Clinical research associate Dalya [phone]. Mom stated BF going OK but she doesn't have milk. Mom is BF/formula feeding until her milk comes in. Hand expression demonstrated showing mom colostrum. Mom excited. Discussed latching getting good deep latches cheeks to breast and using props for support. Newborn behavior, STS, I&O reviewed. Mom encouraged to feed baby 8-12 times/24 hours and with feeding cues.   Mom stated she doesn't have any questions at this time. Encouraged mom to BF first before giving formula. Encouraged mom to call for questions or concerns.  Patient Name: Allison Holmes [id number] Date: 07/04/2022 Reason for consult: Initial assessment;Term Age:28 hours  Maternal Data Has patient been taught Hand Expression?: Yes Does the patient have breastfeeding experience prior to this delivery?: Yes How long did the patient breastfeed?: 1st child 1 yr 9 mon. 2nd child 2 yrs  Feeding Nipple Type: Slow - flow  LATCH Score       Type of Nipple: Everted at rest and after stimulation  Comfort (Breast/Nipple): Soft / non-tender         Lactation Tools Discussed/Used    Interventions Interventions: Breast feeding basics reviewed;Hand express;LC Services brochure  Discharge    Consult Status Consult Status: Follow-up Date: 07/05/22 Follow-up type: In-patient    Charyl Dancer 07/04/2022, 8:06 PM

## 2022-07-04 NOTE — Discharge Summary (Signed)
Postpartum Discharge Summary     Patient Name: Allison Holmes DOB: Jul 01, 1986 MRN: [id number]  Date of admission: 07/03/2022 Delivery date:07/04/2022  Delivering provider: Patriciaann Clan  Date of discharge: 07/05/2022  Admitting diagnosis: Post term pregnancy at [redacted] weeks gestation [O48.0, Z3A.41] Intrauterine pregnancy: [redacted]w[redacted]d    Secondary diagnosis:  Principal Problem:   Vaginal delivery Active Problems:   Language barrier affecting health care   Encounter for supervision of high risk multigravida of advanced maternal age, antepartum   Late prenatal care   Post term pregnancy at 444weeks gestation   Decreased fetal movement  Additional problems: None     Discharge diagnosis: Term Pregnancy Delivered                                              Post partum procedures: None  Augmentation: AROM, Pitocin, and IP Foley Complications: None  Hospital course: Induction of Labor With Vaginal Delivery  36y.o. yo G3P3003 at 462w3das admitted to the hospital 07/03/2022 for induction of labor.  Indication for induction: Postdates and DFM .  Patient had an uncomplicated labor course as follows: Membrane Rupture Time/Date: 3:10 AM ,07/04/2022   Delivery Method:Vaginal, Spontaneous  Episiotomy: None  Lacerations:  None  Details of delivery can be found in separate delivery note.  Patient had a routine postpartum course.  She is eating, drinking, voiding, and ambulating without issue.  Her pain and bleeding are controlled.  She is formula feeding well.  Patient is discharged home 07/05/22.  Newborn Data: Birth date:07/04/2022  Birth time:7:43 AM  Gender:Female  Living status:Living  Apgars:9 ,9  Weight:3395 g   Magnesium Sulfate received: No BMZ received: No Rhophylac: N/A MMR: N/A - Immune  T-DaP: Given prenatally Flu: No Transfusion: No  Physical exam  Vitals:   07/04/22 1749 07/04/22 2027 07/04/22 2343 07/05/22 0300  BP: 113/79 109/63 100/70 114/72  Pulse: 78 65 73 71  Resp:  16     Temp: 98 F (36.7 C)     TempSrc: Oral     SpO2:      Weight:      Height:       General: alert, cooperative, and no distress Lochia: appropriate Uterine Fundus: firm and below umbilicus  DVT Evaluation: no LE edema or calf tenderness to palpation   Labs: Lab Results  Component Value Date   WBC 9.8 07/03/2022   HGB 12.5 07/03/2022   HCT 37.6 07/03/2022   MCV 83.2 07/03/2022   PLT 218 07/03/2022   Edinburgh Score:    07/04/2022   10:10 AM  Edinburgh Postnatal Depression Scale Screening Tool  I have been able to laugh and see the funny side of things. 0  I have looked forward with enjoyment to things. 0  I have blamed myself unnecessarily when things went wrong. 0  I have been anxious or worried for no good reason. 0  I have felt scared or panicky for no good reason. 0  Things have been getting on top of me. 0  I have been so unhappy that I have had difficulty sleeping. 0  I have felt sad or miserable. 0  I have been so unhappy that I have been crying. 0  The thought of harming myself has occurred to me. 0  Edinburgh Postnatal Depression Scale Total 0   After visit  meds:  Allergies as of 07/05/2022       Reactions   Beef-derived Products    Patient diet preference   Chicken Protein    Patient diet preference   Fish-derived Products    Patient diet preference   Pork-derived Products    Patient diet preference        Medication List     STOP taking these medications    Blood Pressure Kit Devi   famotidine 20 MG tablet Commonly known as: Pepcid       TAKE these medications    acetaminophen 500 MG tablet Commonly known as: TYLENOL Take 2 tablets (1,000 mg total) by mouth every 8 (eight) hours as needed (pain).   CitraNatal Bloom 90-1 MG Tabs Take 1 tablet by mouth daily.   ibuprofen 600 MG tablet Commonly known as: ADVIL Take 1 tablet (600 mg total) by mouth every 6 (six) hours as needed (pain).       Discharge home in stable  condition Infant Feeding: Bottle Infant Disposition: home with mother Discharge instruction: per After Visit Summary and Postpartum booklet. Activity: Advance as tolerated. Pelvic rest for 6 weeks.  Diet: routine diet Future Appointments: Future Appointments  Date Time Provider [place]  08/23/2022  9:15 AM Laury Deep, CNM [facility] None   Follow up Visit: Message sent to [facility] by Dr Higinio Plan:  Please schedule this patient for a In person postpartum visit in 6 weeks with the following provider: Any provider and prefers female providers if possible . Additional Postpartum F/U:  None   Low risk pregnancy complicated by:  AMA Delivery mode:  Vaginal, Spontaneous  Anticipated Birth Control:  IUD outpatient   AMN video Arabic interpreter [phone]) used for encounter.   07/05/2022 Genia Del, MD

## 2022-07-04 NOTE — Lactation Note (Signed)
This note was copied from a baby's chart. Lactation Consultation Note  Patient Name: Allison Holmes [id number]'K Date: 07/04/2022 Reason for consult: L&D Initial assessment;Term Age:37 hours  LC in to assist with first feeding after birth.  RN states baby had fed on one breast prior to my arrival.  Baby cueing and assisted Mom to use cross cradle hold and baby latched easily.    Encouraged STS and feeding often with cues. Mom aware of assistance on MBU   Maternal Data Does the patient have breastfeeding experience prior to this delivery?: Yes How long did the patient breastfeed?: 2 yrs with 2 previous babies  Feeding Mother's Current Feeding Choice: Breast Milk  LATCH Score Latch: Grasps breast easily, tongue down, lips flanged, rhythmical sucking.  Audible Swallowing: A few with stimulation  Type of Nipple: Everted at rest and after stimulation  Comfort (Breast/Nipple): Soft / non-tender  Hold (Positioning): Assistance needed to correctly position infant at breast and maintain latch.  LATCH Score: 8 Interventions Interventions: Breast feeding basics reviewed;Assisted with latch;Skin to skin;Reverse pressure;Breast compression;Adjust position;Support pillows;Position options  Consult Status Consult Status: Follow-up from L&D Date: 07/04/22 Follow-up type: In-patient    Judee Clara 07/04/2022, 8:33 AM

## 2022-07-04 NOTE — Progress Notes (Signed)
Labor Progress Note Allison Holmes is a 36 y.o. G3P2002 at [redacted]w[redacted]d presented for IOL d/t DFM.   S: Comfortable. Ready to have her water broken.   O:  BP (!) 100/54   Pulse 73   Temp 98.1 F (36.7 C) (Oral)   Resp 16   Ht 5' 7.72" (1.72 m)   Wt 81.3 kg   LMP 09/16/2021   SpO2 97% Comment: room air  BMI 27.48 kg/m  EFM: 150/mod/15x15/none  CVE: Dilation: 6.5 Effacement (%): 70 Station: -3 Presentation: Vertex Exam by:: Boone Master, RN   A&P: 36 y.o. M3O1771 [redacted]w[redacted]d  #Labor: Cervix largely unchanged but head well applied to cervix. After verbal consent with Arabic interpreter via ipad, performed AROM with small amount of clear fluid. Will titrate pit as needed.  #Pain: Epidural  #FWB: Cat I  #GBS negative  Allayne Stack, DO

## 2022-07-04 NOTE — Lactation Note (Signed)
This note was copied from a baby's chart. Lactation Consultation Note  Patient Name: Allison Holmes Today's Date: 07/04/2022   Age:37 hours  LC attempted to visit with mom, but mom and baby were asleep. Will follow up later.   Maternal Data    Feeding Nipple Type: Slow - flow  LATCH Score                    Lactation Tools Discussed/Used    Interventions    Discharge    Consult Status      [name] 07/04/2022, 1:14 PM

## 2022-07-05 ENCOUNTER — Encounter: Payer: Medicaid Other | Admitting: Family Medicine

## 2022-07-05 LAB — BIRTH TISSUE RECOVERY COLLECTION (PLACENTA DONATION)

## 2022-07-05 MED ORDER — ACETAMINOPHEN 500 MG PO TABS: 1000.0000 mg | ORAL_TABLET | Freq: Three times a day (TID) | ORAL | 0 refills | Status: DC | PRN

## 2022-07-05 MED ORDER — IBUPROFEN 600 MG PO TABS: 600.0000 mg | ORAL_TABLET | Freq: Four times a day (QID) | ORAL | 0 refills | Status: DC | PRN

## 2022-07-05 NOTE — Social Work (Addendum)
CSW received consult for hx of social concerns and car seat.  CSW met with MOB to offer support and complete assessment.    Interpreter [id number]  CSW met with MOB at bedside and introduced CSW role. CSW observed MOB in bed and support at bedside. CSW explained the reason for the visit, to assist with needs for the infant. CSW assessed MOB for needs. MOB reported that she does not have a car seat for the infant. CSW offered a car seat and MOB was agreeable. MOB completed necessary documents. MOB reported that she has a crib, diapers, and some wipes for the infant. MOB reported that she can use additional diapers and wipes for the infant.  CSW informed MOB about [employer] MOB gave CSW permission to make a referral. CW inquired if MOB had WIC/FS. MOB that she receives [facility] for her older child and has food stamps. CSW informed MOB to call to add the infant to [facility] benefits. MOB reported understanding. MOB reported that she will use her food stamps and purchase formula until the [facility] appointment.   CSW provided review of Sudden Infant Death Syndrome (SIDS) precautions. MOB reported understanding.   CSW made a referral to [facility]   CSW identifies no further need for intervention and no barriers to discharge at this time.   Kathrin Greathouse, MSW, LCSW [facility] and [place] Worker  [phone] 07/05/2022  12:44 PM

## 2022-07-05 NOTE — Lactation Note (Signed)
This note was copied from a baby's chart. Lactation Consultation Note  Patient Name: Allison Holmes [id number]'V Date: 07/05/2022 Reason for consult: Term;Infant weight loss;Follow-up assessment Age:37 hours P3, term female infant with -2% weight loss. Arabic interpreter used # Nihal [id number] Mom's feeding choice is : breast and formula feeding. Per mom, she BF infant 4 times today and mostly formula feeding, mom feels she doesn't have much milk, LC discussed infant's small tummy size.  Encouraged mom to latch infant first for every feeding before supplementing with formula to stimulate and establish her milk supply. LC did not observe latch, per mom  she BF infant for  20 minutes prior to [facility] entering the room at 1330 pm and finished giving infant 38 mls of formula. LC discussed with mom to help establish her supply to latch infant first for every feeding, if infant is still cuing after BF 1st breast to offer 2nd breast in same feeding, if after offering 2nd BF if cuing then supplement with formula. LC gave BF supplemental sheet, explained by interpeter and mom understands if infant is latching at breast and still cuing after BF on both breast then supplement with formula. Mom given hand pump and explained how to use fitted with 24 mm breast flange. Discharge education discussed see discharge information below. Mom's plan:  1- Going forward mom will latch infant first for every feeding, 8 to 12+ times within 24 hours, skin to skin. 2- Latch infant first on both breast during a feeding and if infant is still cuing then give infant formula.  3- LC gave mom BF support after discharge: LC hotline, online and in person BF support group at [facility] and [facility] hotline.  Maternal Data    Feeding Mother's Current Feeding Choice: Breast Milk and Formula Nipple Type: Slow - flow  LATCH Score                    Lactation Tools Discussed/Used    Interventions Interventions: Skin to  skin;Position options;Hand pump;Education;LC Services brochure  Discharge Discharge Education: Engorgement and breast care;Warning signs for feeding baby Pump: Manual (LC gave mom hand pump for home prn use) WIC Program: No  Consult Status Consult Status: Complete Follow-up type: Physician    Danelle Earthly 07/05/2022, 2:13 PM

## 2022-07-08 ENCOUNTER — Inpatient Hospital Stay (HOSPITAL_COMMUNITY): Payer: Medicaid Other

## 2022-07-08 ENCOUNTER — Inpatient Hospital Stay (HOSPITAL_COMMUNITY): Admission: AD | Admit: 2022-07-08 | Payer: Medicaid Other | Source: Home / Self Care | Admitting: Family Medicine

## 2022-07-12 ENCOUNTER — Telehealth (HOSPITAL_COMMUNITY): Payer: Self-pay | Admitting: *Deleted

## 2022-07-12 NOTE — Telephone Encounter (Signed)
Voicemail not setup. Unable to leave message.  Duffy Rhody, RN 07-12-2022 at 2:37pm

## 2022-07-31 DIAGNOSIS — Z419 Encounter for procedure for purposes other than remedying health state, unspecified: Secondary | ICD-10-CM | POA: Diagnosis not present

## 2022-08-23 ENCOUNTER — Encounter: Payer: Self-pay | Admitting: Obstetrics and Gynecology

## 2022-08-23 ENCOUNTER — Encounter: Payer: Self-pay | Admitting: *Deleted

## 2022-08-23 ENCOUNTER — Ambulatory Visit (INDEPENDENT_AMBULATORY_CARE_PROVIDER_SITE_OTHER): Payer: Medicaid Other | Admitting: Obstetrics and Gynecology

## 2022-08-23 DIAGNOSIS — Z789 Other specified health status: Secondary | ICD-10-CM

## 2022-08-23 NOTE — Progress Notes (Signed)
Post Partum Visit Note  Noelani Harbach is a 36 y.o. G84P3003 female who presents for a postpartum visit. She is 7 week postpartum following a normal spontaneous vaginal delivery.  I have fully reviewed the prenatal and intrapartum course. The delivery was at 40 gestational weeks.  Anesthesia: epidural. Postpartum course has been unremarkable. Baby is doing well. Baby is feeding by breast. Bleeding no bleeding. Bowel function is  abnormal-constipated . Bladder function is normal. Patient is sexually active. Contraception method is  unsure - considering IUD . Postpartum depression screening: negative.   Upstream - 08/23/22 0945       Pregnancy Intention Screening   Does the patient want to become pregnant in the next year? No    Does the patient's partner want to become pregnant in the next year? No    Would the patient like to discuss contraceptive options today? Yes            The pregnancy intention screening data noted above was reviewed. Potential methods of contraception were discussed. The patient elected to proceed with no method today. She reports "loss of mood" and overall not liking s/e of Mirena IUD.   Edinburgh Postnatal Depression Scale - 08/23/22 0943       Edinburgh Postnatal Depression Scale:  In the Past 7 Days   I have been able to laugh and see the funny side of things. 0    I have looked forward with enjoyment to things. 0    I have blamed myself unnecessarily when things went wrong. 0    I have been anxious or worried for no good reason. 0    I have felt scared or panicky for no good reason. 0    Things have been getting on top of me. 0    I have been so unhappy that I have had difficulty sleeping. 0    I have felt sad or miserable. 0    I have been so unhappy that I have been crying. 0    The thought of harming myself has occurred to me. 0    Edinburgh Postnatal Depression Scale Total 0             Health Maintenance Due  Topic Date Due   PAP  SMEAR-Modifier  03/21/2021   INFLUENZA VACCINE  07/31/2022    The following portions of the patient's history were reviewed and updated as appropriate: allergies, current medications, past family history, past medical history, past social history, past surgical history, and problem list.  Review of Systems Constitutional: negative Eyes: negative Ears, nose, mouth, throat, and face: negative Respiratory: negative Cardiovascular: negative Gastrointestinal: positive for constipation Genitourinary:negative Integument/breast: negative Hematologic/lymphatic: negative Musculoskeletal:negative Neurological: negative Behavioral/Psych: negative Endocrine: negative Allergic/Immunologic: negative  Objective:  BP 108/72   Pulse 69   Ht 5' 10.47" (1.79 m)   Wt 159 lb 10.1 oz (72.4 kg)   LMP 09/16/2021   Breastfeeding Yes   BMI 22.60 kg/m    General:  alert, cooperative, and no distress   Breasts:  not indicated  Lungs: Normal effort  Heart:  regular rate and rhythm  Abdomen: Not done    Wound N/a  GU exam:  not indicated       Assessment:   1) Encounter for postpartum care after hospital delivery - Normal postpartum exam.   2) Language barrier affecting health care  - [employer] In-Person Arabic Interpreter, [place], used for entire visit.  Plan:   Essential  components of care per ACOG recommendations:  1.  Mood and well being: Patient with negative depression screening today. Reviewed local resources for support.  - Patient tobacco use? No.   - hx of drug use? No.    2. Infant care and feeding:  -Patient currently breastmilk feeding? Yes.  -Social determinants of health (SDOH) reviewed in EPIC. No concerns  3. Sexuality, contraception and birth spacing - Patient does not want a pregnancy in the next year.  Desired family size is 3 children.  - Reviewed reproductive life planning. Reviewed contraceptive methods based on pt preferences and  effectiveness.  Patient desired IUD or IUS. Wants to schedule for later time.   - Discussed birth spacing of 18 months  4. Sleep and fatigue -Encouraged family/partner/community support of 4 hrs of uninterrupted sleep to help with mood and fatigue  5. Physical Recovery  - Discussed patients delivery and complications. She describes her labor as good. - Patient had a Vaginal, no problems at delivery. Patient had no laceration at delivery. Perineal healing reviewed. Patient expressed understanding - Patient has urinary incontinence? No. - Patient is safe to resume physical and sexual activity  6.  Health Maintenance - HM due items addressed Yes - Last pap smear  Diagnosis  Date Value Ref Range Status  03/21/2018   Final   NEGATIVE FOR INTRAEPITHELIAL LESIONS OR MALIGNANCY.  03/21/2018   Final   FUNGAL ORGANISMS PRESENT CONSISTENT WITH CANDIDA SPP.   Pap smear not done at today's visit.  -Breast Cancer screening indicated? No.   7. Chronic Disease/Pregnancy Condition follow up: None  - PCP follow up  Raelyn Mora, CNM [facility] [employer]

## 2022-08-23 NOTE — Patient Instructions (Signed)
Copper Intrauterine Device (IUD) ?? ??? ??????? ???? ?????? ??????? ?????.  ????? ???????? ?????? ???? ????????? ??????? ?? ?????? ??? ??????? (???????) ??? ???????.  ??? ????? ??? ??? ????? ????? ?????? ?????. ???? ??????? ??? ?????? ?????? ????? ???? ???? ??????? ?????? ?? ??????? ??? ???? ???? ?????. ????? ???????? ???????? ????????:? ParaGard T380A ?? ?? ??????? ???? ??? ?? ???? ??? ???? ??????? ?????? ??? ????? ??? ???????  ?? ????? ??? ????? ?? ??? ??? ?????? ??? ?? ??? ??????? ????:  ???? ??? ??? ??? ??????  ???????? ??? ?? ??? ?????? ???? (?????????)? ????? ?????? ????? ??? ?????  ???? ?? ????? ???? ????? ??? ??????? ??????? ???????  ??? ????? ????? ???????  ???? ??????? ????????? ?? ????? ???? ?? ?? ?????? ??????? ???????  ???? ???? ?? ???? ????? ?? ???? ????? ???? ???? ?? ???? ????  ????? ?? ????? ???? ????? ?? ????? ????????  ????? ?????? ???????  ???? ???? ????? ?????? ?? ????  ??? ?????? ????? (PID)  ?????? ???????? ?????? (STI)  ?????? ????? ?????? ???????  ???? ?????? ??? ??????  ?? ??? ??? ???? ?? ?????? ???? ??????? ?? ??????? ????????? ?? ?????? ???????  ?? ??? ??? ???? ?? ?????? ???? ??????? ??????? ?? ???????? ?? ???????? ?? ?????? ???????  ????? ?? ?????? ????? ??????? ???????? ??? ??? ???? ??????? ??? ??????? ??? ??? ??? ?????? ???? ????? ?????? ???? ??????. ???? ????? ???? ???? ?????? ?????? ?? ?? ??? ??? ?????? ???.  ???? ?? ????? ??? ????????? ?????? ?? ?? ???.  ?? ????? ??????? ?? ???? ?? ???????. ???? ??? ???? ??????? ???? ??????? ??? ?????? ???????.  ?? ???? ???? ???? ??? ????? ????. ?????? ??????? : ??? ?????? ??? ?????? ???? ????? ???? ?? ??? ??????? ???? ????? ?????? ?? ?????? ?? ???? ??????? ?????. ??????: ?????? ??? ?????? ?? ???? ??? ???. ?? ????? ????? ??? ?? ????? ??? ??????. ???? ?? ???? (????) ????? ?? ????? ???.  ??? ??????? ?????? ?? 10 ????? ??? ??? ?????? ?? ????????? ?? ??????? ??? ????? ?? ????? ??? ?????. ?? ?? ??????? ???? ?? ?????? ?? ???  ??????? ????????? ???????? ??? ??????. ??? ??????? ?? ?? ??? ?? ????????? ????????. ?? ?????? ???? ??????? ?????? ????? ??? ??????? ?? ??????? ?? ??????? ???? ???? ?? ???????? ???????? ???? ????????. ?????? ????? ??? ??? ???? ?? ???? ?????? ?? ?????? ?????? ??? ???????. ?? ?????? ??? ??????? ?? ?????. ?? ???? ??? ???? ????? ??? ??????? ??? ??????? ?? ?????? ???? ??????? ?????? ???? ??????. ????? ??? ???? ?????? ??? ??? ?????? ?? ????? ?? ??????? ??? ????.  ??? ????? ??????.  ?? ????? ?????? ?? ???? ????? ?? ????? ??? ??????? ??????? ?? ?? ???? ???? ?????? ??????.  ????? ???? ?????? ??? ???? ??? ?? ????? ?????? ??? ??????? ??????? ?? ????? ?????? ??????.  ?????? ?????? ?? ???? ?????? ?????? ?? ???? ??? ??????? ??????? ???? ?????? ?????? ????? ??? ????? ????? ??????.  ?? ????? ?????.  ?????? ?? ???? ?????? ?? ??????? ?????? ??? ?? ???? ?????.  ????? ????? ?????? ??? ????? ??? ????? ??????? ?? ?????? ???? ?? ??????? ?? ??? ???? ?? ?????? ??????? ??? ???????. ?? ???? ?????? ?? ????? ????.  ??? ?????? ??? ??? ?????? ?? ?????.  ??? ????? ???? ??????? ??????? ????? ???????? ???? ?????? ??? ????????? ?????? ????? ??????. ?? ???? ??????? ???????? ???????? (????????) ??? ????? ???? ??????.  ?? ??? ?? ??????? ???????? ???????? (????????) ?? ????? ?????? ???????. ???? ??? ??? ?????? ????? ??? ?? ???? ??????? ??????? ?????????? (  MRI) ?? ?? ???? ????? ???.  ??? ??? ??????? ??????? ??????????? ????? ???? ?????? ??? ????? ?????? ??? ??? ?????? ????????. ?? ?? ?????? ???????? ???? ???? ?? ??????? ??? ???? ??? ???????  ?????? ???????? ???? ??? ?? ???? ???? ??????? ??????? ?? ??? ?? ???? ??? ????:  ??????? ????????--????? ??????? ?????? ??????????? (?????)? ???? ????? ?? ?????? ?? ?????? ?? ?????  ?????? ??????? ??????  ?????? ??? ????? ???? ???????--????? ?? ????? ??? ???????? ??????? ??????? ????? ??????  ??? ?????? ????? (PID) - ?????? ??? ?? ?????? ??? ?? ?????? ??? ?? ????? ?? ??????? ???? ??? ???? ????? ?? ???  ?????? ?????  ??????? ???????? (????????)  ??? ????? ?????--??????? ?? ?????? ??????? ?? ??????? ?? ????????? ?? ????? ??????? ?? ????? ?? ????? ??? ???????  ??? ??????? ?? ????? ?? ????? ??? ???????? ?? ???????? ?? ?????? ?? ???????? ?? ????? ???? ????? ????? ???? ?? ???????  ??????? ??? ?????? ?? ???? ?? ????? ?? ??????  ???? ?? ????? ?? ???? ?? ?????? ?????? ???????? ???? ?? ????? ????? ???? (???? ????? ?? ???? ??????? ??? ??? ?????? ?? ???? ????? ?????):  ??? ?????  ????? ????? ??? ?????? ?? ???? ????? ?? ??? ?????  ?????? ????? ??????? ?????? ??? ??????? ?? ?? ??? ?? ?????? ???????? ????????. ???? ?????? ????????? ?????? ?? ?????? ????????. ????? ??????? ?? ?????? ???????? ?????? ??????? ???????? ????????? (FDA) ??? ????? [phone]??? ??? ??? ???? ???????? ??????? ?? ????? ???. ??????: ??? ??????? ????? ?? ????: ?? ?? ???? ??? ???????? ?? ????????? ???????. ??? ???? ???? ?? ????? ?? ??? ??????? ????? ??? ?????? ?? ??????? ?? ???? ??????? ??????.   2023 Elsevier/Gold Standard (2022-05-16 00:00:00)

## 2022-08-31 DIAGNOSIS — Z419 Encounter for procedure for purposes other than remedying health state, unspecified: Secondary | ICD-10-CM | POA: Diagnosis not present

## 2022-09-12 ENCOUNTER — Other Ambulatory Visit: Payer: Self-pay | Admitting: Emergency Medicine

## 2022-09-12 DIAGNOSIS — Z3482 Encounter for supervision of other normal pregnancy, second trimester: Secondary | ICD-10-CM

## 2022-09-12 MED ORDER — CITRANATAL BLOOM 90-1 MG PO TABS: 1.0000 | ORAL_TABLET | Freq: Every day | ORAL | 6 refills | Status: DC

## 2022-09-12 NOTE — Progress Notes (Signed)
PNV refilled, patient breast feeding, requested Rx.

## 2022-09-30 DIAGNOSIS — Z419 Encounter for procedure for purposes other than remedying health state, unspecified: Secondary | ICD-10-CM | POA: Diagnosis not present

## 2022-10-23 ENCOUNTER — Ambulatory Visit: Payer: Medicaid Other | Admitting: Advanced Practice Midwife

## 2022-10-31 DIAGNOSIS — Z419 Encounter for procedure for purposes other than remedying health state, unspecified: Secondary | ICD-10-CM | POA: Diagnosis not present

## 2022-11-30 DIAGNOSIS — Z419 Encounter for procedure for purposes other than remedying health state, unspecified: Secondary | ICD-10-CM | POA: Diagnosis not present

## 2022-12-31 DIAGNOSIS — Z419 Encounter for procedure for purposes other than remedying health state, unspecified: Secondary | ICD-10-CM | POA: Diagnosis not present

## 2023-01-31 DIAGNOSIS — Z419 Encounter for procedure for purposes other than remedying health state, unspecified: Secondary | ICD-10-CM | POA: Diagnosis not present

## 2023-03-01 DIAGNOSIS — Z419 Encounter for procedure for purposes other than remedying health state, unspecified: Secondary | ICD-10-CM | POA: Diagnosis not present

## 2023-04-01 DIAGNOSIS — Z419 Encounter for procedure for purposes other than remedying health state, unspecified: Secondary | ICD-10-CM | POA: Diagnosis not present

## 2023-05-01 DIAGNOSIS — Z419 Encounter for procedure for purposes other than remedying health state, unspecified: Secondary | ICD-10-CM | POA: Diagnosis not present

## 2023-06-01 DIAGNOSIS — Z419 Encounter for procedure for purposes other than remedying health state, unspecified: Secondary | ICD-10-CM | POA: Diagnosis not present

## 2023-07-01 DIAGNOSIS — Z419 Encounter for procedure for purposes other than remedying health state, unspecified: Secondary | ICD-10-CM | POA: Diagnosis not present

## 2023-07-27 ENCOUNTER — Encounter (HOSPITAL_COMMUNITY): Payer: Self-pay | Admitting: Family Medicine

## 2023-07-27 ENCOUNTER — Inpatient Hospital Stay (HOSPITAL_COMMUNITY)
Admission: AD | Admit: 2023-07-27 | Discharge: 2023-07-28 | Disposition: A | Discharge status: Home / Self Care | Payer: Medicaid Other | Attending: Family Medicine | Admitting: Family Medicine

## 2023-07-27 DIAGNOSIS — R109 Unspecified abdominal pain: Secondary | ICD-10-CM | POA: Diagnosis not present

## 2023-07-27 DIAGNOSIS — O26891 Other specified pregnancy related conditions, first trimester: Secondary | ICD-10-CM | POA: Diagnosis not present

## 2023-07-27 DIAGNOSIS — Z3A12 12 weeks gestation of pregnancy: Secondary | ICD-10-CM | POA: Diagnosis not present

## 2023-07-27 DIAGNOSIS — O09521 Supervision of elderly multigravida, first trimester: Secondary | ICD-10-CM | POA: Diagnosis not present

## 2023-07-27 DIAGNOSIS — O09511 Supervision of elderly primigravida, first trimester: Secondary | ICD-10-CM | POA: Diagnosis not present

## 2023-07-27 LAB — URINALYSIS, ROUTINE W REFLEX MICROSCOPIC
Bilirubin Urine: NEGATIVE
Glucose, UA: NEGATIVE mg/dL
Hgb urine dipstick: NEGATIVE
Ketones, ur: NEGATIVE mg/dL
Leukocytes,Ua: NEGATIVE
Nitrite: NEGATIVE
Protein, ur: NEGATIVE mg/dL
Specific Gravity, Urine: 1.008 (ref 1.005–1.030)
pH: 5 (ref 5.0–8.0)

## 2023-07-27 LAB — POCT PREGNANCY, URINE: Preg Test, Ur: POSITIVE — AB

## 2023-07-27 NOTE — MAU Note (Signed)
.  Allison Holmes is a 37 y.o. at Unknown here in MAU reporting: lower abd pain and pelvic "heaviness" for about last week. C/o white discharge that is itchy. Took HPt  and it was positive on July 5.  LMP: 05/12/2023 Onset of complaint: 1 week Pain score: + There were no vitals filed for this visit.   FHT:n/a Lab orders placed from triage:

## 2023-07-28 ENCOUNTER — Inpatient Hospital Stay (HOSPITAL_COMMUNITY): Payer: Medicaid Other

## 2023-07-28 DIAGNOSIS — O26891 Other specified pregnancy related conditions, first trimester: Secondary | ICD-10-CM | POA: Diagnosis not present

## 2023-07-28 DIAGNOSIS — Z3A12 12 weeks gestation of pregnancy: Secondary | ICD-10-CM

## 2023-07-28 DIAGNOSIS — R109 Unspecified abdominal pain: Secondary | ICD-10-CM | POA: Diagnosis not present

## 2023-07-28 DIAGNOSIS — O09511 Supervision of elderly primigravida, first trimester: Secondary | ICD-10-CM | POA: Diagnosis not present

## 2023-07-28 LAB — CBC
HCT: 40 % (ref 36.0–46.0)
Hemoglobin: 13.1 g/dL (ref 12.0–15.0)
MCH: 26.1 pg (ref 26.0–34.0)
MCHC: 32.8 g/dL (ref 30.0–36.0)
MCV: 79.8 fL — ABNORMAL LOW (ref 80.0–100.0)
Platelets: 284 10*3/uL (ref 150–400)
RBC: 5.01 MIL/uL (ref 3.87–5.11)
RDW: 13 % (ref 11.5–15.5)
WBC: 10.8 10*3/uL — ABNORMAL HIGH (ref 4.0–10.5)
nRBC: 0 % (ref 0.0–0.2)

## 2023-07-28 LAB — COMPREHENSIVE METABOLIC PANEL WITH GFR
ALT: 14 U/L (ref 0–44)
AST: 19 U/L (ref 15–41)
Albumin: 3.6 g/dL (ref 3.5–5.0)
Alkaline Phosphatase: 57 U/L (ref 38–126)
Anion gap: 14 (ref 5–15)
BUN: 9 mg/dL (ref 6–20)
CO2: 20 mmol/L — ABNORMAL LOW (ref 22–32)
Calcium: 9.8 mg/dL (ref 8.9–10.3)
Chloride: 101 mmol/L (ref 98–111)
Creatinine, Ser: 0.61 mg/dL (ref 0.44–1.00)
GFR, Estimated: >60 mL/min (ref >60)
Glucose, Bld: 86 mg/dL (ref 70–99)
Potassium: 4.1 mmol/L (ref 3.5–5.1)
Sodium: 135 mmol/L (ref 135–145)
Total Bilirubin: 0.5 mg/dL (ref 0.3–1.2)
Total Protein: 7.7 g/dL (ref 6.5–8.1)

## 2023-07-28 LAB — WET PREP, GENITAL
Clue Cells Wet Prep HPF POC: NONE SEEN
Sperm: NONE SEEN
Trich, Wet Prep: NONE SEEN
WBC, Wet Prep HPF POC: <10 (ref <10)
Yeast Wet Prep HPF POC: NONE SEEN

## 2023-07-28 LAB — HCG, QUANTITATIVE, PREGNANCY: hCG, Beta Chain, Quant, S: 87196 m[IU]/mL — ABNORMAL HIGH (ref <5)

## 2023-07-28 NOTE — Discharge Instructions (Signed)
  [place] [facility] Providers          [facility]  [address], [place], [place] [address]  [phone]  [facility]  [address], [place], [place] [address]  [phone]  [facility]  [address], [place], [place] [address]  [phone]  [facility] [address], [place], [place] [address] [phone]  [facility] [place]  [address], [place], [place] [address]  [phone]  [facility]  [address]First floor), [place], [place] [address]  [phone]  [facility]  [address], [place], [place] [address]  [phone]  [facility]  [address], [place], [place] [address]  [phone]  [facility]  [address], [place], [place] [address]  [phone]  [facility]  [address], [place], [place] [address]  [phone]  [facility]  [address], [place], [place] [address]  [phone]  [facility]  [address], [place], [place] [address]  [phone]  [facility]   [address], [place], [place] [address]  [phone]  [facility]  [address], [place], [place] [address]   [phone]  [facility] [address], [place], [place] [address] [phone]  [facility]  [address], , [place] [address]  [phone]         

## 2023-07-28 NOTE — MAU Provider Note (Signed)
History     CSN: [id number]  Arrival date and time: 07/27/23 2208   Event Date/Time   First Provider Initiated Contact with Patient 07/28/23 0057      Chief Complaint  Patient presents with   Abdominal Pain    Allison Holmes is a 37 y.o. G4P3003 at [redacted]w[redacted]d Unsure LMP of 05/12/2023 who has not established care.  She does report having some bleeding in June, but was unsure if it was a period.  She presents today for abdominal pain.  She reports it has been ongoing for one week. She describes it as a heaviness in the lower abdominal area.  She denies vaginal bleeding or discharge. She rates her pain a 6/10.  OB History     Gravida  4   Para  3   Term  3   Preterm  0   AB  0   Living  3      SAB  0   IAB  0   Ectopic  0   Multiple  0   Live Births  3           Past Medical History:  Diagnosis Date   Medical history non-contributory     Past Surgical History:  Procedure Laterality Date   NO PAST SURGERIES      Family History  Problem Relation Age of Onset   Cancer Neg Hx    Diabetes Neg Hx    Hypertension Neg Hx     Social History   Tobacco Use   Smoking status: Never   Smokeless tobacco: Never  Vaping Use   Vaping status: Never Used  Substance Use Topics   Alcohol use: No   Drug use: Never    Allergies:  Allergies  Allergen Reactions   Beef-Derived Products     Patient diet preference    Chicken Protein     Patient diet preference   Fish-Derived Products     Patient diet preference   Pork-Derived Products     Patient diet preference    Medications Prior to Admission  Medication Sig Dispense Refill Last Dose   acetaminophen (TYLENOL) 500 MG tablet Take 2 tablets (1,000 mg total) by mouth every 8 (eight) hours as needed (pain). 60 tablet 0    ibuprofen (ADVIL) 600 MG tablet Take 1 tablet (600 mg total) by mouth every 6 (six) hours as needed (pain). 40 tablet 0    Prenatal-DSS-FeCb-FeGl-FA (CITRANATAL BLOOM) 90-1 MG TABS Take 1  tablet by mouth daily. 30 tablet 6     Review of Systems  Gastrointestinal:  Positive for abdominal pain (Pressure), nausea and vomiting. Negative for constipation and diarrhea.  Genitourinary:  Negative for difficulty urinating, dysuria, vaginal bleeding and vaginal discharge.   Physical Exam   Blood pressure 117/74, pulse 87, temperature 98.2 F (36.8 C), resp. rate 18, weight 78.5 kg, last menstrual period 05/12/2023, currently breastfeeding.  Physical Exam Vitals reviewed. Exam conducted with a chaperone present.  Constitutional:      Appearance: Normal appearance. She is well-developed.  HENT:     Head: Normocephalic and atraumatic.  Eyes:     Conjunctiva/sclera: Conjunctivae normal.  Cardiovascular:     Rate and Rhythm: Normal rate and regular rhythm.  Pulmonary:     Effort: Pulmonary effort is normal. No respiratory distress.  Abdominal:     Tenderness: There is abdominal tenderness in the right upper quadrant, right lower quadrant, left upper quadrant and left lower quadrant. There is no guarding or  rebound.  Musculoskeletal:        General: Normal range of motion.     Cervical back: Normal range of motion.  Skin:    General: Skin is warm and dry.  Neurological:     Mental Status: She is alert and oriented to person, place, and time.  Psychiatric:        Mood and Affect: Mood normal.        Behavior: Behavior normal.     MAU Course  Procedures Results for orders placed or performed during the hospital encounter of 07/27/23 (from the past 24 hour(s))  Urinalysis, Routine w reflex microscopic -Urine, Clean Catch     Status: Abnormal   Collection Time: 07/27/23 10:36 PM  Result Value Ref Range   Color, Urine STRAW (A) YELLOW   APPearance CLEAR CLEAR   Specific Gravity, Urine 1.008 1.005 - 1.030   pH 5.0 5.0 - 8.0   Glucose, UA NEGATIVE NEGATIVE mg/dL   Hgb urine dipstick NEGATIVE NEGATIVE   Bilirubin Urine NEGATIVE NEGATIVE   Ketones, ur NEGATIVE NEGATIVE  mg/dL   Protein, ur NEGATIVE NEGATIVE mg/dL   Nitrite NEGATIVE NEGATIVE   Leukocytes,Ua NEGATIVE NEGATIVE  Pregnancy, urine POC     Status: Abnormal   Collection Time: 07/27/23 11:12 PM  Result Value Ref Range   Preg Test, Ur POSITIVE (A) NEGATIVE  Wet prep, genital     Status: None   Collection Time: 07/28/23 12:03 AM   Specimen: Vaginal  Result Value Ref Range   Yeast Wet Prep HPF POC NONE SEEN NONE SEEN   Trich, Wet Prep NONE SEEN NONE SEEN   Clue Cells Wet Prep HPF POC NONE SEEN NONE SEEN   WBC, Wet Prep HPF POC <10 <10   Sperm NONE SEEN   CBC     Status: Abnormal   Collection Time: 07/28/23 12:11 AM  Result Value Ref Range   WBC 10.8 (H) 4.0 - 10.5 K/uL   RBC 5.01 3.87 - 5.11 MIL/uL   Hemoglobin 13.1 12.0 - 15.0 g/dL   HCT 65.7 84.6 - 96.2 %   MCV 79.8 (L) 80.0 - 100.0 fL   MCH 26.1 26.0 - 34.0 pg   MCHC 32.8 30.0 - 36.0 g/dL   RDW 95.2 84.1 - 32.4 %   Platelets 284 150 - 400 K/uL   nRBC 0.0 0.0 - 0.2 %  hCG, quantitative, pregnancy     Status: Abnormal   Collection Time: 07/28/23 12:11 AM  Result Value Ref Range   hCG, Beta Chain, Quant, S 87,196 (H) <5 mIU/mL  Comprehensive metabolic panel     Status: Abnormal   Collection Time: 07/28/23 12:11 AM  Result Value Ref Range   Sodium 135 135 - 145 mmol/L   Potassium 4.1 3.5 - 5.1 mmol/L   Chloride 101 98 - 111 mmol/L   CO2 20 (L) 22 - 32 mmol/L   Glucose, Bld 86 70 - 99 mg/dL   BUN 9 6 - 20 mg/dL   Creatinine, Ser 4.01 0.44 - 1.00 mg/dL   Calcium 9.8 8.9 - 02.7 mg/dL   Total Protein 7.7 6.5 - 8.1 g/dL   Albumin 3.6 3.5 - 5.0 g/dL   AST 19 15 - 41 U/L   ALT 14 0 - 44 U/L   Alkaline Phosphatase 57 38 - 126 U/L   Total Bilirubin 0.5 0.3 - 1.2 mg/dL   GFR, Estimated >25 >36 mL/min   Anion gap 14 5 - 15   US OB  Comp Less 14 Wks  Result Date: 07/28/2023 CLINICAL DATA:  Pelvic pain. EXAM: OBSTETRIC <14 WK ULTRASOUND TECHNIQUE: Transabdominal ultrasound was performed for evaluation of the gestation as well as the  maternal uterus and adnexal regions. COMPARISON:  None Available. FINDINGS: Intrauterine gestational sac: Single Yolk sac:  Visualized. Embryo:  Visualized. Cardiac Activity: Visualized. Heart Rate: 158 bpm CRL:   58.1 mm   12 w 2 d                  [place] EDC: February 07, 2024 Subchorionic hemorrhage:  None visualized. Maternal uterus/adnexae: The bilateral ovaries are not visualized. No pelvic free fluid is seen. IMPRESSION: Single, viable intrauterine pregnancy at approximately 12 weeks and 2 days gestation by ultrasound evaluation. Electronically Signed   By: Aram Candela M.D.   On: 07/28/2023 01:33     MDM Exam Cultures: Wet Prep and GC/CT Labs: UA, UPT, CBC, hCG, CMP Ultrasound Assessment and Plan  37 year old G4P3003 at 11 weeks Abdominal Pressure  -Reviewed POC with patient. -Exam performed and findings discussed.  -Cultures collected, by nurse, and return normal.  -Patient informed of findings.  -Patient offered and declines pain medication.  -Send for [place] and await results.   Cherre Robins 07/28/2023, 12:57 AM   Reassessment (1:53 AM) -Results as above. -Provider to bedside to discuss. -Patient informed EDD change to reflect new date since LMP was approximate. -Patient questions regarding cause of cramping. Reassured that some cramping can be normal in pregnancy.  Instructed to monitor.  -Instructed to start [facility]. List provided in AVS.  -Precautions reviewed. -Discharged to home in stable condition.  Cherre Robins MSN, CNM Advanced Practice Provider, Center for [employer]

## 2023-07-29 LAB — GC/CHLAMYDIA PROBE AMP (~~LOC~~) NOT AT ARMC
Chlamydia: NEGATIVE
Comment: NEGATIVE
Comment: NORMAL
Neisseria Gonorrhea: NEGATIVE

## 2023-08-01 DIAGNOSIS — Z419 Encounter for procedure for purposes other than remedying health state, unspecified: Secondary | ICD-10-CM | POA: Diagnosis not present

## 2023-09-01 DIAGNOSIS — Z419 Encounter for procedure for purposes other than remedying health state, unspecified: Secondary | ICD-10-CM | POA: Diagnosis not present

## 2023-09-18 ENCOUNTER — Encounter: Payer: Self-pay | Admitting: Family Medicine

## 2023-09-18 ENCOUNTER — Encounter: Payer: Self-pay | Admitting: General Practice

## 2023-09-18 ENCOUNTER — Ambulatory Visit (INDEPENDENT_AMBULATORY_CARE_PROVIDER_SITE_OTHER): Payer: Medicaid Other | Admitting: General Practice

## 2023-09-18 ENCOUNTER — Other Ambulatory Visit: Payer: Self-pay

## 2023-09-18 VITALS — BP 107/63 | HR 109 | Ht 67.0 in | Wt 177.0 lb

## 2023-09-18 DIAGNOSIS — O09529 Supervision of elderly multigravida, unspecified trimester: Secondary | ICD-10-CM | POA: Insufficient documentation

## 2023-09-18 DIAGNOSIS — O0990 Supervision of high risk pregnancy, unspecified, unspecified trimester: Secondary | ICD-10-CM | POA: Insufficient documentation

## 2023-09-18 DIAGNOSIS — O09522 Supervision of elderly multigravida, second trimester: Secondary | ICD-10-CM

## 2023-09-18 DIAGNOSIS — Z3A19 19 weeks gestation of pregnancy: Secondary | ICD-10-CM

## 2023-09-18 MED ORDER — VITAFOL ULTRA 29-0.6-0.4-200 MG PO CAPS
1.0000 | ORAL_CAPSULE | Freq: Every day | ORAL | 11 refills | Status: DC
Start: 2023-09-18 — End: 2024-03-17

## 2023-09-18 MED ORDER — GOJJI WEIGHT SCALE MISC: 1.0000 | Freq: Once | 0 refills | Status: AC

## 2023-09-18 MED ORDER — DOXYLAMINE-PYRIDOXINE 10-10 MG PO TBEC: DELAYED_RELEASE_TABLET | ORAL | 2 refills | Status: DC

## 2023-09-18 MED ORDER — BLOOD PRESSURE KIT DEVI
1.0000 | Freq: Once | 0 refills | Status: AC
Start: 2023-09-18 — End: 2023-09-18

## 2023-09-18 NOTE — Progress Notes (Signed)
New OB Intake  Nurse is located at [facility] and pt is located at [facility].  I explained I am completing New OB Intake today. We discussed EDD of 02/07/2024, by Ultrasound. Pt is G4P3003. I reviewed her allergies, medications and Medical/Surgical/OB history.    Patient Active Problem List   Diagnosis Date Noted   Supervision of high risk pregnancy, antepartum 09/18/2023   Advanced maternal age in multigravida 09/18/2023   Language barrier affecting health care 03/22/2018    Concerns addressed today Patient reports headache in the afternoon for the past 3 days- is relieved by Advil. Advised to d/c Advil and only take Tylenol. Discussed calling us if unrelieved with Tylenol or rest or is associated with blurry vision/dizziness. Patient also reports heartburn which she takes Diclegis for- recommended trying Tums instead and to let us know if it doesn't improve. Diclegis & PNV Rx sent to pharmacy per patient request.   Delivery Plans Plans to deliver at [facility]. Discussed the nature of our practice with multiple providers including residents and students. Due to the size of the practice, the delivering provider may not be the same as those providing prenatal care.    MyChart/Babyscripts N/a  Blood Pressure Cuff/Weight Scale Blood pressure cuff ordered for patient to pick-up from [employer]. Explained after first prenatal appt pt will check weekly.  Patient does not have weight scale; order sent to [facility], patient may track weight weekly in Babyscripts.  Anatomy US Explained first scheduled [place] will be around 19 weeks. Anatomy US scheduled for October 15 at 12:15pm.  Is patient a CenteringPregnancy candidate?  Not a candidate due to Language barrier    Is patient a Mom+Baby Combined Care candidate?  Not a candidate     Interested in [place]? If yes, send referral and doula dot phrase.  no  Is patient a candidate for Babyscripts Optimization? No - language barrier  First  visit review I reviewed new OB appt with patient. Explained pt will be seen by Dr Debroah Loop at first visit. Discussed Avelina Laine genetic screening with patient. Patient would like Panorama drawn- Horizon completed last pregnancy. Routine prenatal labs collected today. Patient will had Pap smear at new OB appt.  Last Pap Diagnosis  Date Value Ref Range Status  03/21/2018   Final   NEGATIVE FOR INTRAEPITHELIAL LESIONS OR MALIGNANCY.  03/21/2018   Final   FUNGAL ORGANISMS PRESENT CONSISTENT WITH CANDIDA SPP.    Marylynn Pearson, RN 09/18/2023  9:30 AM

## 2023-09-19 LAB — CBC/D/PLT+RPR+RH+ABO+RUBIGG...
Antibody Screen: NEGATIVE
Basophils Absolute: 0 10*3/uL (ref 0.0–0.2)
Basos: 0 %
EOS (ABSOLUTE): 0.1 10*3/uL (ref 0.0–0.4)
Eos: 1 %
HCV Ab: NONREACTIVE
HIV Screen 4th Generation wRfx: NONREACTIVE
Hematocrit: 38.1 % (ref 34.0–46.6)
Hemoglobin: 12.6 g/dL (ref 11.1–15.9)
Hepatitis B Surface Ag: NEGATIVE
Immature Grans (Abs): 0.1 10*3/uL (ref 0.0–0.1)
Immature Granulocytes: 1 %
Lymphocytes Absolute: 2.7 10*3/uL (ref 0.7–3.1)
Lymphs: 26 %
MCH: 27.2 pg (ref 26.6–33.0)
MCHC: 33.1 g/dL (ref 31.5–35.7)
MCV: 82 fL (ref 79–97)
Monocytes Absolute: 0.5 10*3/uL (ref 0.1–0.9)
Monocytes: 5 %
Neutrophils Absolute: 6.9 10*3/uL (ref 1.4–7.0)
Neutrophils: 67 %
Platelets: 258 10*3/uL (ref 150–450)
RBC: 4.63 x10E6/uL (ref 3.77–5.28)
RDW: 13.2 % (ref 11.7–15.4)
RPR Ser Ql: NONREACTIVE
Rh Factor: POSITIVE
Rubella Antibodies, IGG: >33 index (ref >0.99)
WBC: 10.3 10*3/uL (ref 3.4–10.8)

## 2023-09-19 LAB — HCV INTERPRETATION

## 2023-09-19 LAB — HEMOGLOBIN A1C
Est. average glucose Bld gHb Est-mCnc: 111 mg/dL
Hgb A1c MFr Bld: 5.5 % (ref 4.8–5.6)

## 2023-09-20 LAB — CULTURE, OB URINE

## 2023-09-20 LAB — URINE CULTURE, OB REFLEX

## 2023-09-27 ENCOUNTER — Ambulatory Visit (INDEPENDENT_AMBULATORY_CARE_PROVIDER_SITE_OTHER): Payer: Medicaid Other | Admitting: Obstetrics & Gynecology

## 2023-09-27 ENCOUNTER — Other Ambulatory Visit: Payer: Self-pay

## 2023-09-27 VITALS — BP 110/72 | HR 93 | Wt 177.1 lb

## 2023-09-27 DIAGNOSIS — O0992 Supervision of high risk pregnancy, unspecified, second trimester: Secondary | ICD-10-CM

## 2023-09-27 DIAGNOSIS — Z3A21 21 weeks gestation of pregnancy: Secondary | ICD-10-CM

## 2023-09-27 DIAGNOSIS — Z1332 Encounter for screening for maternal depression: Secondary | ICD-10-CM

## 2023-09-27 DIAGNOSIS — O0990 Supervision of high risk pregnancy, unspecified, unspecified trimester: Secondary | ICD-10-CM

## 2023-09-27 NOTE — Progress Notes (Signed)
Subjective:    Allison Holmes is a [id number] [redacted]w[redacted]d being seen today for her first obstetrical visit.  Her obstetrical history is significant for advanced maternal age. Patient does intend to breast feed. Pregnancy history fully reviewed.  Patient reports nausea. Episodes of SOB and possible tachycardia Vitals:   09/27/23 0940  BP: 110/72  Pulse: 93  Weight: 177 lb 1.6 oz (80.3 kg)    HISTORY: OB History  Gravida Para Term Preterm AB Living  4 3 3  0 0 3  SAB IAB Ectopic Multiple Live Births  0 0 0 0 3    # Outcome Date GA Lbr Len/2nd Weight Sex Type Anes PTL Lv  4 Current           3 Term 07/04/22 [redacted]w[redacted]d 10:45 / 00:58 7 lb 7.8 oz (3.395 kg) F Vag-Spont EPI  LIV  2 Term 09/08/18 [redacted]w[redacted]d 11:19 / 00:21 8 lb 2.9 oz (3.71 kg) M Vag-Spont EPI  LIV  1 Term 08/31/16 [redacted]w[redacted]d 09:35 / 00:54 7 lb 7.8 oz (3.396 kg) M Vag-Spont None  LIV   Past Medical History:  Diagnosis Date   Medical history non-contributory    Past Surgical History:  Procedure Laterality Date   NO PAST SURGERIES     Family History  Problem Relation Age of Onset   Cancer Neg Hx    Diabetes Neg Hx    Hypertension Neg Hx      Exam    Uterus:   21  Pelvic Exam:    Perineum: Deferred per pt request   Vulva:    Vagina:     pH:    Cervix:    Adnexa:    Bony Pelvis:   System: Breast:     Skin: normal coloration and turgor, no rashes    Neurologic: oriented, normal mood   Extremities: normal strength, tone, and muscle mass   HEENT PERRLA and extra ocular movement intact   Mouth/Teeth mucous membranes moist, pharynx normal without lesions   Neck supple and no masses   Cardiovascular: regular rate and rhythm, no murmurs or gallops   Respiratory:  appears well, vitals normal, no respiratory distress, acyanotic, normal RR, chest clear, no wheezing, crepitations, rhonchi, normal symmetric air entry   Abdomen: soft, non-tender; bowel sounds normal; no masses,  no organomegaly   Urinary:       Assessment:     Pregnancy: [id number] Patient Active Problem List   Diagnosis Date Noted   Supervision of high risk pregnancy, antepartum 09/18/2023   Advanced maternal age in multigravida 09/18/2023   Language barrier affecting health care 03/22/2018        Plan:     Initial labs drawn. Prenatal vitamins. Problem list reviewed and updated. Genetic Screening discussed : ordered.  Ultrasound discussed; fetal survey: ordered.  Follow up in 5 weeks. 50% of 30 min visit spent on counseling and coordination of care.  Orders Placed This Encounter  Procedures   AFP, Serum, Open Spina Bifida    Order Specific Question:   Is patient insulin dependent?    Answer:   No    Order Specific Question:   Weight (lbs)    Answer:   177    Order Specific Question:   Gestational Age (GA), weeks    Answer:   59    Order Specific Question:   Date on which patient was at this GA    Answer:   09/27/2023    Order Specific Question:   GA Calculation Method  Answer:   LMP    Order Specific Question:   Number of fetuses    Answer:   1   AMB Referral to Cardio Obstetrics    Referral Priority:   Routine    Referral Type:   Consultation    Referral Reason:   Specialty Services Required    Requested Specialty:   Cardiology    Number of Visits Requested:   1      Scheryl Darter 09/27/2023

## 2023-09-29 LAB — PANORAMA PRENATAL TEST FULL PANEL:PANORAMA TEST PLUS 5 ADDITIONAL MICRODELETIONS: FETAL FRACTION: 8.3

## 2023-09-29 LAB — AFP, SERUM, OPEN SPINA BIFIDA
AFP MoM: 2.1
AFP Value: 120.4 ng/mL
Gest. Age on Collection Date: 21 wk
Maternal Age At EDD: 39.1 a
OSBR Risk 1 IN: 647
Test Results:: NEGATIVE
Weight: 177 [lb_av]

## 2023-09-30 ENCOUNTER — Telehealth: Payer: Self-pay | Admitting: Lactation Services

## 2023-09-30 ENCOUNTER — Encounter: Payer: Self-pay | Admitting: *Deleted

## 2023-09-30 NOTE — Telephone Encounter (Signed)
Received drug change request for Diclegis. Called and spoke with Pharmacy. Patient did pick up on 9/18 and has 3 refills left.

## 2023-10-01 DIAGNOSIS — Z419 Encounter for procedure for purposes other than remedying health state, unspecified: Secondary | ICD-10-CM | POA: Diagnosis not present

## 2023-10-15 ENCOUNTER — Other Ambulatory Visit: Payer: Self-pay

## 2023-10-15 ENCOUNTER — Other Ambulatory Visit: Payer: Self-pay | Admitting: *Deleted

## 2023-10-15 ENCOUNTER — Ambulatory Visit: Payer: Medicaid Other | Attending: Obstetrics & Gynecology

## 2023-10-15 ENCOUNTER — Ambulatory Visit: Payer: Medicaid Other

## 2023-10-15 DIAGNOSIS — O09522 Supervision of elderly multigravida, second trimester: Secondary | ICD-10-CM

## 2023-10-15 DIAGNOSIS — Z3A23 23 weeks gestation of pregnancy: Secondary | ICD-10-CM | POA: Diagnosis not present

## 2023-10-15 DIAGNOSIS — O0990 Supervision of high risk pregnancy, unspecified, unspecified trimester: Secondary | ICD-10-CM | POA: Diagnosis not present

## 2023-10-15 DIAGNOSIS — Z362 Encounter for other antenatal screening follow-up: Secondary | ICD-10-CM

## 2023-10-31 ENCOUNTER — Other Ambulatory Visit: Payer: Self-pay

## 2023-10-31 DIAGNOSIS — O0990 Supervision of high risk pregnancy, unspecified, unspecified trimester: Secondary | ICD-10-CM

## 2023-11-01 DIAGNOSIS — Z419 Encounter for procedure for purposes other than remedying health state, unspecified: Secondary | ICD-10-CM | POA: Diagnosis not present

## 2023-11-04 ENCOUNTER — Encounter: Payer: Medicaid Other | Admitting: Family Medicine

## 2023-11-04 ENCOUNTER — Other Ambulatory Visit: Payer: Medicaid Other

## 2023-11-12 ENCOUNTER — Ambulatory Visit: Payer: Medicaid Other | Admitting: Cardiology

## 2023-11-14 ENCOUNTER — Ambulatory Visit: Payer: Medicaid Other

## 2023-11-15 ENCOUNTER — Other Ambulatory Visit: Payer: Medicaid Other

## 2023-11-15 ENCOUNTER — Other Ambulatory Visit: Payer: Self-pay

## 2023-11-15 ENCOUNTER — Ambulatory Visit (INDEPENDENT_AMBULATORY_CARE_PROVIDER_SITE_OTHER): Payer: Medicaid Other | Admitting: Obstetrics and Gynecology

## 2023-11-15 ENCOUNTER — Encounter: Payer: Self-pay | Admitting: General Practice

## 2023-11-15 VITALS — BP 119/66 | HR 89 | Wt 185.9 lb

## 2023-11-15 DIAGNOSIS — O0990 Supervision of high risk pregnancy, unspecified, unspecified trimester: Secondary | ICD-10-CM

## 2023-11-15 DIAGNOSIS — Z758 Other problems related to medical facilities and other health care: Secondary | ICD-10-CM

## 2023-11-15 DIAGNOSIS — O0993 Supervision of high risk pregnancy, unspecified, third trimester: Secondary | ICD-10-CM

## 2023-11-15 DIAGNOSIS — Z3A28 28 weeks gestation of pregnancy: Secondary | ICD-10-CM

## 2023-11-15 DIAGNOSIS — Z603 Acculturation difficulty: Secondary | ICD-10-CM

## 2023-11-15 DIAGNOSIS — O99613 Diseases of the digestive system complicating pregnancy, third trimester: Secondary | ICD-10-CM

## 2023-11-15 DIAGNOSIS — O09523 Supervision of elderly multigravida, third trimester: Secondary | ICD-10-CM

## 2023-11-15 DIAGNOSIS — K219 Gastro-esophageal reflux disease without esophagitis: Secondary | ICD-10-CM

## 2023-11-15 MED ORDER — PANTOPRAZOLE SODIUM 40 MG PO TBEC: 40.0000 mg | DELAYED_RELEASE_TABLET | Freq: Every day | ORAL | 2 refills | Status: DC

## 2023-11-15 NOTE — Progress Notes (Signed)
   PRENATAL VISIT NOTE  Subjective:  Allison Holmes is a 37 y.o. G4P3003 at [redacted]w[redacted]d being seen today for ongoing prenatal care.  She is currently monitored for the following issues for this high-risk pregnancy and has Language barrier affecting health care; Supervision of high risk pregnancy, antepartum; and Advanced maternal age in multigravida on their problem list.  Patient doing well with no acute concerns today. She reports heartburn and dental pain .  Contractions: Not present. Vag. Bleeding: None.  Movement: Present. Denies leaking of fluid.   The following portions of the patient's history were reviewed and updated as appropriate: allergies, current medications, past family history, past medical history, past social history, past surgical history and problem list. Problem list updated.  Objective:   Vitals:   11/15/23 0929  BP: 119/66  Pulse: 89  Weight: 185 lb 14.4 oz (84.3 kg)    Fetal Status: Fetal Heart Rate (bpm): 140 Fundal Height: 28 cm Movement: Present     General:  Alert, oriented and cooperative. Patient is in no acute distress.  Skin: Skin is warm and dry. No rash noted.   Cardiovascular: Normal heart rate noted  Respiratory: Normal respiratory effort, no problems with respiration noted  Abdomen: Soft, gravid, appropriate for gestational age.  Pain/Pressure: Absent     Pelvic: Cervical exam deferred        Extremities: Normal range of motion.  Edema: None  Mental Status:  Normal mood and affect. Normal behavior. Normal judgment and thought content.   Assessment and Plan:  Pregnancy: G4P3003 at [redacted]w[redacted]d  1. [redacted] weeks gestation of pregnancy   2. Multigravida of advanced maternal age in third trimester   3. Language barrier affecting health care Live interpreter present  4. Supervision of high risk pregnancy, antepartum Continue routine prenatal care Pt has noted some dental pain/issues.  Pt given note to seek care with a dentist for further eval and  treatment  5. Gastroesophageal reflux during pregnancy in third trimester, antepartum Pt states Tums has been ineffective Rx for protonix sent  - pantoprazole (PROTONIX) 40 MG tablet; Take 1 tablet (40 mg total) by mouth daily.  Dispense: 30 tablet; Refill: 2  Preterm labor symptoms and general obstetric precautions including but not limited to vaginal bleeding, contractions, leaking of fluid and fetal movement were reviewed in detail with the patient.  Please refer to After Visit Summary for other counseling recommendations.   Return in about 2 weeks (around 11/29/2023) for [facility], in person.   Mariel Aloe, MD Faculty Attending Center for [facility]

## 2023-11-15 NOTE — Patient Instructions (Signed)
Senokot for constipation

## 2023-11-16 LAB — CBC
Hematocrit: 33.5 % — ABNORMAL LOW (ref 34.0–46.6)
Hemoglobin: 10.8 g/dL — ABNORMAL LOW (ref 11.1–15.9)
MCH: 26.7 pg (ref 26.6–33.0)
MCHC: 32.2 g/dL (ref 31.5–35.7)
MCV: 83 fL (ref 79–97)
Platelets: 266 10*3/uL (ref 150–450)
RBC: 4.05 x10E6/uL (ref 3.77–5.28)
RDW: 13.3 % (ref 11.7–15.4)
WBC: 9.3 10*3/uL (ref 3.4–10.8)

## 2023-11-16 LAB — GLUCOSE TOLERANCE, 2 HOURS W/ 1HR
Glucose, 1 hour: 135 mg/dL (ref 70–179)
Glucose, 2 hour: 115 mg/dL (ref 70–152)
Glucose, Fasting: 74 mg/dL (ref 70–91)

## 2023-11-16 LAB — HIV ANTIBODY (ROUTINE TESTING W REFLEX): HIV Screen 4th Generation wRfx: NONREACTIVE

## 2023-11-16 LAB — RPR: RPR Ser Ql: NONREACTIVE

## 2023-11-16 MED ORDER — FERRIC MALTOL 30 MG PO CAPS: 1.0000 | ORAL_CAPSULE | ORAL | 5 refills | Status: DC

## 2023-11-18 ENCOUNTER — Ambulatory Visit: Payer: Medicaid Other

## 2023-11-26 ENCOUNTER — Ambulatory Visit: Payer: Medicaid Other | Attending: Obstetrics

## 2023-11-26 ENCOUNTER — Other Ambulatory Visit: Payer: Self-pay | Admitting: *Deleted

## 2023-11-26 DIAGNOSIS — Z3A29 29 weeks gestation of pregnancy: Secondary | ICD-10-CM

## 2023-11-26 DIAGNOSIS — O09522 Supervision of elderly multigravida, second trimester: Secondary | ICD-10-CM | POA: Diagnosis not present

## 2023-11-26 DIAGNOSIS — O335XX0 Maternal care for disproportion due to unusually large fetus, not applicable or unspecified: Secondary | ICD-10-CM

## 2023-11-26 DIAGNOSIS — Z362 Encounter for other antenatal screening follow-up: Secondary | ICD-10-CM | POA: Diagnosis not present

## 2023-12-01 DIAGNOSIS — Z419 Encounter for procedure for purposes other than remedying health state, unspecified: Secondary | ICD-10-CM | POA: Diagnosis not present

## 2023-12-02 ENCOUNTER — Encounter: Payer: Medicaid Other | Admitting: Obstetrics and Gynecology

## 2023-12-03 ENCOUNTER — Encounter: Payer: Medicaid Other | Admitting: Advanced Practice Midwife

## 2023-12-03 DIAGNOSIS — Z603 Acculturation difficulty: Secondary | ICD-10-CM

## 2023-12-03 DIAGNOSIS — O0990 Supervision of high risk pregnancy, unspecified, unspecified trimester: Secondary | ICD-10-CM

## 2023-12-03 DIAGNOSIS — Z3A30 30 weeks gestation of pregnancy: Secondary | ICD-10-CM

## 2023-12-03 DIAGNOSIS — O09523 Supervision of elderly multigravida, third trimester: Secondary | ICD-10-CM

## 2023-12-03 NOTE — Progress Notes (Unsigned)
   PRENATAL VISIT NOTE  Subjective:  Allison Holmes is a 37 y.o. G4P3003 at [redacted]w[redacted]d being seen today for ongoing prenatal care.  She is currently monitored for the following issues for this {Blank single:19197::"high-risk","low-risk"} pregnancy and has Language barrier affecting health care; Supervision of high risk pregnancy, antepartum; and Advanced maternal age in multigravida on their problem list.  Patient reports {[id number]}.   .  .   . Denies leaking of fluid.   The following portions of the patient's history were reviewed and updated as appropriate: allergies, current medications, past family history, past medical history, past social history, past surgical history and problem list.   Objective:  There were no vitals filed for this visit.  Fetal Status:           General:  Alert, oriented and cooperative. Patient is in no acute distress.  Skin: Skin is warm and dry. No rash noted.   Cardiovascular: Normal heart rate noted  Respiratory: Normal respiratory effort, no problems with respiration noted  Abdomen: Soft, gravid, appropriate for gestational age.        Pelvic: {Blank single:19197::"Cervical exam performed in the presence of a chaperone","Cervical exam deferred"}        Extremities: Normal range of motion.     Mental Status: Normal mood and affect. Normal behavior. Normal judgment and thought content.   Assessment and Plan:  Pregnancy: G4P3003 at [redacted]w[redacted]d 1. Supervision of high risk pregnancy in third trimester ***  2. [redacted] weeks gestation of pregnancy ***  3. Language barrier affecting health care ***  4. Multigravida of advanced maternal age in third trimester ***  {Blank single:19197::"Term","Preterm"} labor symptoms and general obstetric precautions including but not limited to vaginal bleeding, contractions, leaking of fluid and fetal movement were reviewed in detail with the patient. Please refer to After Visit Summary for other counseling recommendations.   No  follow-ups on file.  Future Appointments  Date Time Provider Department Center  12/04/2023 10:55 AM Bernerd Limbo, CNM [facility]  12/11/2023 11:30 AM Thomasene Ripple, DO [facility] None  12/31/2023 11:15 AM [facility] NURSE [facility]  12/31/2023 11:30 AM [facility] US1 [facility]    Bernerd Limbo, CNM

## 2023-12-04 ENCOUNTER — Ambulatory Visit: Payer: Medicaid Other | Admitting: Certified Nurse Midwife

## 2023-12-04 ENCOUNTER — Other Ambulatory Visit: Payer: Self-pay

## 2023-12-04 ENCOUNTER — Encounter: Payer: Self-pay | Admitting: Certified Nurse Midwife

## 2023-12-04 VITALS — BP 104/67 | HR 79 | Wt 188.4 lb

## 2023-12-04 DIAGNOSIS — Z23 Encounter for immunization: Secondary | ICD-10-CM | POA: Diagnosis not present

## 2023-12-04 DIAGNOSIS — Z3A30 30 weeks gestation of pregnancy: Secondary | ICD-10-CM

## 2023-12-04 DIAGNOSIS — O0993 Supervision of high risk pregnancy, unspecified, third trimester: Secondary | ICD-10-CM

## 2023-12-04 DIAGNOSIS — Z758 Other problems related to medical facilities and other health care: Secondary | ICD-10-CM

## 2023-12-04 DIAGNOSIS — O0990 Supervision of high risk pregnancy, unspecified, unspecified trimester: Secondary | ICD-10-CM

## 2023-12-04 DIAGNOSIS — O09523 Supervision of elderly multigravida, third trimester: Secondary | ICD-10-CM

## 2023-12-04 DIAGNOSIS — Z603 Acculturation difficulty: Secondary | ICD-10-CM

## 2023-12-04 DIAGNOSIS — O99513 Diseases of the respiratory system complicating pregnancy, third trimester: Secondary | ICD-10-CM

## 2023-12-04 DIAGNOSIS — J310 Chronic rhinitis: Secondary | ICD-10-CM

## 2023-12-04 MED ORDER — FLUTICASONE PROPIONATE 50 MCG/ACT NA SUSP
1.0000 | Freq: Two times a day (BID) | NASAL | 0 refills | Status: DC
Start: 2023-12-04 — End: 2024-01-16

## 2023-12-04 NOTE — Progress Notes (Signed)
   PRENATAL VISIT NOTE  Subjective:  Allison Holmes is a 37 y.o. G4P3003 at [redacted]w[redacted]d being seen today for ongoing prenatal care.  She is currently monitored for the following issues for this high-risk pregnancy and has Language barrier affecting health care; Supervision of high risk pregnancy, antepartum; and Advanced maternal age in multigravida on their problem list.  Patient reports recent nasal congestion and runny nose with associated anosmia (resolved). She denies fever, fatigue, HA, sneezing, sore throat, facial pain/fullness, post-nasal drip.    Contractions: Not present. Vag. Bleeding: None.  Movement: Present. Denies leaking of fluid.   The following portions of the patient's history were reviewed and updated as appropriate: allergies, current medications, past family history, past medical history, past social history, past surgical history and problem list.   Objective:   Vitals:   12/04/23 1133  BP: 104/67  Pulse: 79  Weight: 188 lb 6.4 oz (85.5 kg)    Fetal Status: Fetal Heart Rate (bpm): 140 Fundal Height: 31 cm Movement: Present     General:  Alert, oriented and cooperative. Patient is in no acute distress.  Skin: Skin is warm and dry. No rash noted.   Cardiovascular: Normal heart rate noted  Respiratory: Normal respiratory effort, no problems with respiration noted  Abdomen: Soft, gravid, appropriate for gestational age.  Pain/Pressure: Absent     Pelvic: Cervical exam deferred        Extremities: Normal range of motion.  Edema: None  Mental Status: Normal mood and affect. Normal behavior. Normal judgment and thought content.   Assessment and Plan:  Pregnancy: G4P3003 at [redacted]w[redacted]d  1. Supervision of high risk pregnancy in third trimester -  Patient stable and feeling well. FHR and FH wnl - Tdap vaccine greater than or equal to 7yo IM  2. [redacted] weeks gestation of pregnancy - Anticipatory guidance about next visits/weeks of pregnancy given   3. Language barrier  affecting health care - Arabic interpreter utilized during visit.   4. Multigravida of advanced maternal age in third trimester   5. Pregnancy rhinitis - fluticasone (FLONASE) 50 MCG/ACT nasal spray; Place 1 spray into both nostrils in the morning and at bedtime.  Dispense: 48 mL; Refill: 0  - Advised nasal saline rinses and gave instructions for use - Drink plenty of fluids, may sleep slightly upright if helpful.   Preterm labor symptoms and general obstetric precautions including but not limited to vaginal bleeding, contractions, leaking of fluid and fetal movement were reviewed in detail with the patient.  Please refer to After Visit Summary for other counseling recommendations.   Return in about 2 weeks (around 12/18/2023).  Future Appointments  Date Time Provider Department Center  12/11/2023 11:30 AM Thomasene Ripple, DO [facility] None  12/18/2023  3:15 PM [facility]  12/31/2023 11:15 AM [facility] NURSE [facility]  12/31/2023 11:30 AM [facility] US1 [facility]    Ralene Muskrat, PA-C

## 2023-12-11 ENCOUNTER — Ambulatory Visit: Payer: Medicaid Other | Attending: Cardiology | Admitting: Cardiology

## 2023-12-11 ENCOUNTER — Encounter: Payer: Self-pay | Admitting: Cardiology

## 2023-12-11 VITALS — BP 110/70 | HR 95 | Ht 67.0 in | Wt 186.6 lb

## 2023-12-11 DIAGNOSIS — R0609 Other forms of dyspnea: Secondary | ICD-10-CM

## 2023-12-11 DIAGNOSIS — Z3A31 31 weeks gestation of pregnancy: Secondary | ICD-10-CM

## 2023-12-11 NOTE — Addendum Note (Signed)
Addended by: Thomasene Ripple on: 12/11/2023 01:55 PM   Modules accepted: Level of Service

## 2023-12-11 NOTE — Progress Notes (Signed)
Cardio-Obstetrics Clinic  New Evaluation   Date:  12/11/2023   ID:  Allison Holmes, DOB 1986/02/04, MRN [id number]  PCP:  Patient, No Pcp Per   [facility] Providers Cardiologist:  Thomasene Ripple, DO  Electrophysiologist:  None        Referring MD: Adam Phenix, MD   Chief Complaint: " I am shortness of breath"  History of Present Illness:    Allison Holmes is a 37 y.o. female [G4P3003] who presents for evaluation for shortness of breath.  She is currently 31-week pregnant individual with three prior uncomplicated pregnancies, presents with new episodes of fainting and heaviness in the belly. The episodes are characterized by a brief loss of consciousness, followed by a period of feeling heavy and unable to stand up for about five minutes. The patient denies any precursory symptoms before these episodes. She also reports having a closed nose due to allergies, which has been a recurring issue for the past two years. The patient denies any personal or family history of heart problems. She is currently on medication for reflux, which she only takes during pregnancy.   Prior CV Studies Reviewed: The following studies were reviewed today:   Past Medical History:  Diagnosis Date   Medical history non-contributory     Past Surgical History:  Procedure Laterality Date   NO PAST SURGERIES        OB History     Gravida  4   Para  3   Term  3   Preterm  0   AB  0   Living  3      SAB  0   IAB  0   Ectopic  0   Multiple  0   Live Births  3               Current Medications: Current Meds  Medication Sig   Ferric Maltol 30 MG CAPS Take 1 capsule (30 mg total) by mouth every other day.   fluticasone (FLONASE) 50 MCG/ACT nasal spray Place 1 spray into both nostrils in the morning and at bedtime.   pantoprazole (PROTONIX) 40 MG tablet Take 1 tablet (40 mg total) by mouth daily.   Prenat-Fe Poly-Methfol-FA-DHA (VITAFOL ULTRA) 29-0.6-0.4-200 MG  CAPS Take 1 capsule by mouth daily.     Allergies:   Pork-derived products   Social History   Socioeconomic History   Marital status: Married    Spouse name: Not on file   Number of children: Not on file   Years of education: Not on file   Highest education level: Not on file  Occupational History   Not on file  Tobacco Use   Smoking status: Never    Passive exposure: Never   Smokeless tobacco: Never  Vaping Use   Vaping status: Never Used  Substance and Sexual Activity   Alcohol use: No   Drug use: Never   Sexual activity: Yes  Other Topics Concern   Not on file  Social History Narrative   Not on file   Social Determinants of Health   Financial Resource Strain: Not on file  Food Insecurity: Not on file  Transportation Needs: Not on file  Physical Activity: Not on file  Stress: Not on file  Social Connections: Not on file      Family History  Problem Relation Age of Onset   Cancer Neg Hx    Diabetes Neg Hx    Hypertension Neg Hx  ROS:   Please see the history of present illness.     All other systems reviewed and are negative.   Labs/EKG Reviewed:    EKG:  none today   Recent Labs: 07/28/2023: ALT 14; BUN 9; Creatinine, Ser 0.61; Potassium 4.1; Sodium 135 11/15/2023: Hemoglobin 10.8; Platelets 266   Recent Lipid Panel No results found for: "CHOL", "TRIG", "HDL", "CHOLHDL", "LDLCALC", "LDLDIRECT"  Physical Exam:    VS:  BP 110/70 (BP Location: Left Arm, Patient Position: Sitting, Cuff Size: Normal)   Pulse 95   Ht 5\' 7"  (1.702 m)   Wt 186 lb 9.6 oz (84.6 kg)   LMP 05/12/2023   SpO2 98%   BMI 29.23 kg/m     Wt Readings from Last 3 Encounters:  12/11/23 186 lb 9.6 oz (84.6 kg)  12/04/23 188 lb 6.4 oz (85.5 kg)  11/15/23 185 lb 14.4 oz (84.3 kg)     GEN: Well nourished, well developed in no acute distress HEENT: Normal NECK: No JVD; No carotid bruits LYMPHATICS: No lymphadenopathy CARDIAC: RRR, no murmurs, rubs,  gallops RESPIRATORY:  Clear to auscultation without rales, wheezing or rhonchi  ABDOMEN: Soft, non-tender, non-distended MUSCULOSKELETAL:  No edema; No deformity  SKIN: Warm and dry NEUROLOGIC:  Alert and oriented x 3 PSYCHIATRIC:  Normal affect    Risk Assessment/Risk Calculators:     CARPREG II Risk Prediction Index Score:  1.  The patient's risk for a primary cardiac event is 5%.   Modified World Health Organization [facility]) Classification of Maternal CV Risk   Class I         ASSESSMENT & PLAN:    Syncope in Pregnancy [redacted] weeks pregnant with a history of a syncopal episode. No prior history of similar episodes in previous pregnancies. No family history of postpartum cardiac issues. No personal medical history. -Order echocardiogram to assess cardiac function. -Order hemoglobin test to assess blood count. -Advise to increase fluid intake.   Follow-up in 6 weeks (at approximately [redacted] weeks gestation) to assess overall health and progress of pregnancy.  Patient Instructions  Medication Instructions:  Your physician recommends that you continue on your current medications as directed. Please refer to the Current Medication list given to you today.  *If you need a refill on your cardiac medications before your next appointment, please call your pharmacy*   Lab Work: CBC If you have labs (blood work) drawn today and your tests are completely normal, you will receive your results only by: MyChart Message (if you have MyChart) OR A paper copy in the mail If you have any lab test that is abnormal or we need to change your treatment, we will call you to review the results.   Testing/Procedures: Your physician has requested that you have an echocardiogram - OB. Echocardiography is a painless test that uses sound waves to create images of your heart. It provides your doctor with information about the size and shape of your heart and how well your heart's chambers and valves are  working. This procedure takes approximately one hour. There are no restrictions for this procedure. Please do NOT wear cologne, perfume, aftershave, or lotions (deodorant is allowed). Please arrive 15 minutes prior to your appointment time.  Please note: We ask at that you not bring children with you during ultrasound (echo/ vascular) testing. Due to room size and safety concerns, children are not allowed in the ultrasound rooms during exams. Our front office staff cannot provide observation of children in our lobby area while  testing is being conducted. An adult accompanying a patient to their appointment will only be allowed in the ultrasound room at the discretion of the ultrasound technician under special circumstances. We apologize for any inconvenience.    Follow-Up: At [facility], you and your health needs are our priority.  As part of our continuing mission to provide you with exceptional heart care, we have created designated Provider Care Teams.  These Care Teams include your primary Cardiologist (physician) and Advanced Practice Providers (APPs -  Physician Assistants and Nurse Practitioners) who all work together to provide you with the care you need, when you need it.  Your next appointment:   6 week(s)  Provider:   Thomasene Ripple, DO     Dispo:  No follow-ups on file.   Medication Adjustments/Labs and Tests Ordered: Current medicines are reviewed at length with the patient today.  Concerns regarding medicines are outlined above.  Tests Ordered: Orders Placed This Encounter  Procedures   CBC with Differential/Platelet   ECHOCARDIOGRAM COMPLETE   Medication Changes: No orders of the defined types were placed in this encounter.

## 2023-12-11 NOTE — Patient Instructions (Signed)
Medication Instructions:  Your physician recommends that you continue on your current medications as directed. Please refer to the Current Medication list given to you today.  *If you need a refill on your cardiac medications before your next appointment, please call your pharmacy*   Lab Work: CBC If you have labs (blood work) drawn today and your tests are completely normal, you will receive your results only by: MyChart Message (if you have MyChart) OR A paper copy in the mail If you have any lab test that is abnormal or we need to change your treatment, we will call you to review the results.   Testing/Procedures: Your physician has requested that you have an echocardiogram - OB. Echocardiography is a painless test that uses sound waves to create images of your heart. It provides your doctor with information about the size and shape of your heart and how well your heart's chambers and valves are working. This procedure takes approximately one hour. There are no restrictions for this procedure. Please do NOT wear cologne, perfume, aftershave, or lotions (deodorant is allowed). Please arrive 15 minutes prior to your appointment time.  Please note: We ask at that you not bring children with you during ultrasound (echo/ vascular) testing. Due to room size and safety concerns, children are not allowed in the ultrasound rooms during exams. Our front office staff cannot provide observation of children in our lobby area while testing is being conducted. An adult accompanying a patient to their appointment will only be allowed in the ultrasound room at the discretion of the ultrasound technician under special circumstances. We apologize for any inconvenience.    Follow-Up: At [facility], you and your health needs are our priority.  As part of our continuing mission to provide you with exceptional heart care, we have created designated Provider Care Teams.  These Care Teams include your  primary Cardiologist (physician) and Advanced Practice Providers (APPs -  Physician Assistants and Nurse Practitioners) who all work together to provide you with the care you need, when you need it.  Your next appointment:   6 week(s)  Provider:   Thomasene Ripple, DO

## 2023-12-12 ENCOUNTER — Inpatient Hospital Stay (HOSPITAL_COMMUNITY)
Admission: AD | Admit: 2023-12-12 | Discharge: 2023-12-12 | Disposition: A | Discharge status: Home / Self Care | Payer: Medicaid Other | Attending: Obstetrics & Gynecology | Admitting: Obstetrics & Gynecology

## 2023-12-12 ENCOUNTER — Encounter (HOSPITAL_COMMUNITY): Payer: Self-pay | Admitting: Obstetrics & Gynecology

## 2023-12-12 DIAGNOSIS — Z3A31 31 weeks gestation of pregnancy: Secondary | ICD-10-CM | POA: Diagnosis not present

## 2023-12-12 DIAGNOSIS — W19XXXA Unspecified fall, initial encounter: Secondary | ICD-10-CM | POA: Diagnosis not present

## 2023-12-12 DIAGNOSIS — M545 Low back pain, unspecified: Secondary | ICD-10-CM | POA: Insufficient documentation

## 2023-12-12 DIAGNOSIS — R102 Pelvic and perineal pain: Secondary | ICD-10-CM | POA: Diagnosis not present

## 2023-12-12 DIAGNOSIS — O09523 Supervision of elderly multigravida, third trimester: Secondary | ICD-10-CM | POA: Diagnosis not present

## 2023-12-12 DIAGNOSIS — O26893 Other specified pregnancy related conditions, third trimester: Secondary | ICD-10-CM | POA: Diagnosis not present

## 2023-12-12 LAB — CBC WITH DIFFERENTIAL/PLATELET
Basophils Absolute: 0 10*3/uL (ref 0.0–0.2)
Basos: 0 %
EOS (ABSOLUTE): 0.1 10*3/uL (ref 0.0–0.4)
Eos: 1 %
Hematocrit: 34.9 % (ref 34.0–46.6)
Hemoglobin: 11.1 g/dL (ref 11.1–15.9)
Immature Grans (Abs): 0.1 10*3/uL (ref 0.0–0.1)
Immature Granulocytes: 1 %
Lymphocytes Absolute: 2.3 10*3/uL (ref 0.7–3.1)
Lymphs: 27 %
MCH: 26.1 pg — ABNORMAL LOW (ref 26.6–33.0)
MCHC: 31.8 g/dL (ref 31.5–35.7)
MCV: 82 fL (ref 79–97)
Monocytes Absolute: 0.6 10*3/uL (ref 0.1–0.9)
Monocytes: 7 %
Neutrophils Absolute: 5.7 10*3/uL (ref 1.4–7.0)
Neutrophils: 64 %
Platelets: 287 10*3/uL (ref 150–450)
RBC: 4.26 x10E6/uL (ref 3.77–5.28)
RDW: 14 % (ref 11.7–15.4)
WBC: 8.8 10*3/uL (ref 3.4–10.8)

## 2023-12-12 MED ORDER — CYCLOBENZAPRINE HCL 5 MG PO TABS: 5.0000 mg | ORAL_TABLET | Freq: Three times a day (TID) | ORAL | 0 refills | Status: DC | PRN

## 2023-12-12 MED ORDER — CYCLOBENZAPRINE HCL 5 MG PO TABS
5.0000 mg | ORAL_TABLET | Freq: Once | ORAL | Status: AC
  Administered 2023-12-12: 5 mg via ORAL
  Filled 2023-12-12: qty 1

## 2023-12-12 MED ORDER — ACETAMINOPHEN 325 MG PO TABS
650.0000 mg | ORAL_TABLET | Freq: Once | ORAL | Status: AC
  Administered 2023-12-12: 650 mg via ORAL
  Filled 2023-12-12: qty 2

## 2023-12-12 NOTE — MAU Provider Note (Signed)
Chief Complaint:  Fall   Event Date/Time   First Provider Initiated Contact with Patient 12/12/23 0036     HPI: Allison Holmes is a 37 y.o. G4P3003 at 23w6dwho presents to maternity admissions reporting having fallen down some stairs on her bottom yesterday morning.  Saw Dr Servando Salina shortly thereafter but did not come to hospital as directed then.  Saw her for recent episodes of dizziness. . She reports good fetal movement, denies LOF, vaginal bleeding, h/a, or fever/chills.    Fall The accident occurred 12 to 24 hours ago. There was no blood loss. The point of impact was the buttocks. The pain is present in the back. The pain is at a severity of 5/10. Associated symptoms include abdominal pain. She has tried nothing for the symptoms.   RN Note: .Allison Holmes is a 37 y.o. at [redacted]w[redacted]d here in MAU reporting: fall down at 8am while carrying her daughter and fell on her bottom during the fall she passed out "didn't Holmes for a few seconds" when she stood up and is now having back pain, that is more intense when she moves.  Is also having lower abdominal pain that comes and goes  +FM. Denies vaginal bleeding or leaking of fluid.   Past Medical History: Past Medical History:  Diagnosis Date   Medical history non-contributory     Past obstetric history: OB History  Gravida Para Term Preterm AB Living  4 3 3  0 0 3  SAB IAB Ectopic Multiple Live Births  0 0 0 0 3    # Outcome Date GA Lbr Len/2nd Weight Sex Type Anes PTL Lv  4 Current           3 Term 07/04/22 [redacted]w[redacted]d 10:45 / 00:58 3395 g F Vag-Spont EPI  LIV  2 Term 09/08/18 [redacted]w[redacted]d 11:19 / 00:21 3710 g M Vag-Spont EPI  LIV  1 Term 08/31/16 [redacted]w[redacted]d 09:35 / 00:54 3396 g M Vag-Spont None  LIV    Past Surgical History: Past Surgical History:  Procedure Laterality Date   NO PAST SURGERIES      Family History: Family History  Problem Relation Age of Onset   Cancer Neg Hx    Diabetes Neg Hx    Hypertension Neg Hx     Social History: Social  History   Tobacco Use   Smoking status: Never    Passive exposure: Never   Smokeless tobacco: Never  Vaping Use   Vaping status: Never Used  Substance Use Topics   Alcohol use: No   Drug use: Never    Allergies:  Allergies  Allergen Reactions   Pork-Derived Products     Patient diet preference    Meds:  Medications Prior to Admission  Medication Sig Dispense Refill Last Dose   fluticasone (FLONASE) 50 MCG/ACT nasal spray Place 1 spray into both nostrils in the morning and at bedtime. 48 mL 0 12/12/2023   pantoprazole (PROTONIX) 40 MG tablet Take 1 tablet (40 mg total) by mouth daily. 30 tablet 2 Past Week   Prenat-Fe Poly-Methfol-FA-DHA (VITAFOL ULTRA) 29-0.6-0.4-200 MG CAPS Take 1 capsule by mouth daily. 30 capsule 11 12/12/2023   Doxylamine-Pyridoxine (DICLEGIS) 10-10 MG TBEC Take 2 tabs at nighttime. Can take 1 tab at breakfast and 1 tab in afternoon (Patient not taking: Reported on 12/11/2023) 100 tablet 2    Ferric Maltol 30 MG CAPS Take 1 capsule (30 mg total) by mouth every other day. 15 capsule 5     I have reviewed patient's  Past Medical Hx, Surgical Hx, Family Hx, Social Hx, medications and allergies.   ROS:  Review of Systems  Gastrointestinal:  Positive for abdominal pain.   Other systems negative  Physical Exam  Patient Vitals for the past 24 hrs:  BP Temp Temp src Pulse Resp SpO2 Height Weight  12/12/23 0026 128/88 -- -- 77 -- 100 % -- --  12/12/23 0019 128/88 98.1 F (36.7 C) Oral 82 17 100 % 5\' 7"  (1.702 m) 85.8 kg   Constitutional: Well-developed, well-nourished female in no acute distress.  Cardiovascular: normal rate and rhythm Respiratory: normal effort GI: Abd soft, non-tender, gravid appropriate for gestational age.   No rebound or guarding. MS: Extremities nontender, no edema, normal ROM Neurologic: Alert and oriented x 4.  GU: Neg CVAT.  PELVIC EXAM: deferred   FHT:  Baseline 130 , moderate variability, accelerations present, no  decelerations Contractions: Occasional    Labs: Results for orders placed or performed in visit on 12/11/23 (from the past 24 hour(s))  CBC with Differential/Platelet     Status: Abnormal   Collection Time: 12/11/23 12:34 PM  Result Value Ref Range   WBC 8.8 3.4 - 10.8 x10E3/uL   RBC 4.26 3.77 - 5.28 x10E6/uL   Hemoglobin 11.1 11.1 - 15.9 g/dL   Hematocrit 16.1 09.6 - 46.6 %   MCV 82 79 - 97 fL   MCH 26.1 (L) 26.6 - 33.0 pg   MCHC 31.8 31.5 - 35.7 g/dL   RDW 04.5 40.9 - 81.1 %   Platelets 287 150 - 450 x10E3/uL   Neutrophils 64 Not Estab. %   Lymphs 27 Not Estab. %   Monocytes 7 Not Estab. %   Eos 1 Not Estab. %   Basos 0 Not Estab. %   Neutrophils Absolute 5.7 1.4 - 7.0 x10E3/uL   Lymphocytes Absolute 2.3 0.7 - 3.1 x10E3/uL   Monocytes Absolute 0.6 0.1 - 0.9 x10E3/uL   EOS (ABSOLUTE) 0.1 0.0 - 0.4 x10E3/uL   Basophils Absolute 0.0 0.0 - 0.2 x10E3/uL   Immature Granulocytes 1 Not Estab. %   Immature Grans (Abs) 0.1 0.0 - 0.1 x10E3/uL   Narrative   Performed at:  [address], [place], [place]  [id number] Lab Director: Jolene Schimke MD, Phone:  [phone]   B/Positive/-- (09/18 1029)  Imaging:    MAU Course/MDM: I have reviewed the triage vital signs and the nursing notes.   Pertinent labs & imaging results that were available during my care of the patient were reviewed by me and considered in my medical decision making (Holmes chart for details).      I have reviewed her medical records including past results, notes and treatments.   NST reviewed Treatments in MAU included Tylenol and Flexeril   These completely relieved her pain.  Discussed she may be sore for a few days and may use these prn. .    Assessment: Single IUP at [redacted]w[redacted]d Fall, initial encounter - Plan: Discharge patient  [redacted] weeks gestation of pregnancy - Plan: Discharge patient  Acute midline low back pain without sciatica - Plan: Discharge patient  Pelvic pain affecting  pregnancy in third trimester, antepartum - Plan: Discharge patient   Plan: Discharge home Rx Flexeril for back pain May use Tylenol for pain as well  Preterm Labor precautions and fetal kick counts Follow up in Office for prenatal visits and recheck Encouraged to return if she develops worsening of symptoms, increase in pain, fever, or other concerning symptoms.  Pt stable at time of discharge.  Wynelle Bourgeois CNM, MSN Certified Nurse-Midwife 12/12/2023 12:36 AM

## 2023-12-12 NOTE — MAU Note (Signed)
..  Allison Holmes is a 37 y.o. at [redacted]w[redacted]d here in MAU reporting: fall down at 8am while carrying her daughter and fell on her bottom during the fall she passed out "didn't see for a few seconds" when she stood up and is now having back pain, that is more intense when she moves.  Is also having lower abdominal pain that comes and goes  +FM. Denies vaginal bleeding or leaking of fluid.   Pain score: 5/10 Vitals:   12/12/23 0019  BP: 128/88  Pulse: 82  Resp: 17  Temp: 98.1 F (36.7 C)  SpO2: 100%     GMW:NUUVOZD in room  Lab orders placed from triage:  UA

## 2023-12-18 ENCOUNTER — Ambulatory Visit: Payer: Medicaid Other | Admitting: Certified Nurse Midwife

## 2023-12-18 ENCOUNTER — Other Ambulatory Visit: Payer: Self-pay

## 2023-12-18 ENCOUNTER — Encounter: Payer: Self-pay | Admitting: Certified Nurse Midwife

## 2023-12-18 VITALS — BP 121/77 | HR 88 | Wt 189.0 lb

## 2023-12-18 DIAGNOSIS — O0993 Supervision of high risk pregnancy, unspecified, third trimester: Secondary | ICD-10-CM

## 2023-12-18 DIAGNOSIS — O0990 Supervision of high risk pregnancy, unspecified, unspecified trimester: Secondary | ICD-10-CM

## 2023-12-18 DIAGNOSIS — Z3A32 32 weeks gestation of pregnancy: Secondary | ICD-10-CM | POA: Diagnosis not present

## 2023-12-18 NOTE — Progress Notes (Signed)
   PRENATAL VISIT NOTE  Subjective:  Allison Holmes is a 37 y.o. G4P3003 at [redacted]w[redacted]d being seen today for ongoing prenatal care.  She is currently monitored for the following issues for this high-risk pregnancy and has Language barrier affecting health care; Supervision of high risk pregnancy, antepartum; and Advanced maternal age in multigravida on their problem list.  Patient reports no complaints. Went to MAU 12/12/23 for fall with reactive NST, Tylenol and Flexeril for pain control.   Contractions: Not present. Vag. Bleeding: None.  Movement: Present. Denies leaking of fluid.   The following portions of the patient's history were reviewed and updated as appropriate: allergies, current medications, past family history, past medical history, past social history, past surgical history and problem list.   Objective:   Vitals:   12/18/23 1525  BP: 121/77  Pulse: 88  Weight: 189 lb (85.7 kg)    Fetal Status: Fetal Heart Rate (bpm): 141   Movement: Present     General:  Alert, oriented and cooperative. Patient is in no acute distress.  Skin: Skin is warm and dry. No rash noted.   Cardiovascular: Normal heart rate noted  Respiratory: Normal respiratory effort, no problems with respiration noted  Abdomen: Soft, gravid, appropriate for gestational age.  Pain/Pressure: Absent     Pelvic: Cervical exam deferred        Extremities: Normal range of motion.  Edema: None  Mental Status: Normal mood and affect. Normal behavior. Normal judgment and thought content.   Assessment and Plan:  Pregnancy: G4P3003 at [redacted]w[redacted]d  1. Supervision of high risk pregnancy, antepartum (Primary) - Patient stable and feeling well. FHR and FH wnl.  - Tylenol and Flexeril working well for post-fall pain/soreness. Patient feeling vigorous fetal movement.   2. [redacted] weeks gestation of pregnancy - Anticipatory guidance about next visits/weeks of pregnancy given.    Preterm labor symptoms and general obstetric  precautions including but not limited to vaginal bleeding, contractions, leaking of fluid and fetal movement were reviewed in detail with the patient. Please refer to After Visit Summary for other counseling recommendations.   Return in about 2 weeks (around 01/01/2024) for [facility].  Future Appointments  Date Time Provider Department Center  12/31/2023 11:15 AM [facility] NURSE [facility]  12/31/2023 11:30 AM [facility] US1 [facility]  01/15/2024 11:15 AM Osborne Oman [facility]  01/16/2024  8:35 AM [facility] ECHO 5 [id number]  01/21/2024  9:00 AM Tobb, Lavona [place], DO [facility] None    Ralene Muskrat, [place]

## 2023-12-27 ENCOUNTER — Encounter: Payer: Self-pay | Admitting: *Deleted

## 2023-12-27 DIAGNOSIS — O3660X0 Maternal care for excessive fetal growth, unspecified trimester, not applicable or unspecified: Secondary | ICD-10-CM | POA: Insufficient documentation

## 2023-12-31 ENCOUNTER — Other Ambulatory Visit: Payer: Self-pay | Admitting: *Deleted

## 2023-12-31 ENCOUNTER — Ambulatory Visit: Payer: Medicaid Other | Attending: Maternal & Fetal Medicine

## 2023-12-31 ENCOUNTER — Ambulatory Visit: Payer: Medicaid Other | Admitting: *Deleted

## 2023-12-31 VITALS — BP 123/74 | HR 84

## 2023-12-31 DIAGNOSIS — O3663X0 Maternal care for excessive fetal growth, third trimester, not applicable or unspecified: Secondary | ICD-10-CM

## 2023-12-31 DIAGNOSIS — Z3A34 34 weeks gestation of pregnancy: Secondary | ICD-10-CM

## 2023-12-31 DIAGNOSIS — O335XX0 Maternal care for disproportion due to unusually large fetus, not applicable or unspecified: Secondary | ICD-10-CM | POA: Diagnosis not present

## 2023-12-31 DIAGNOSIS — O09523 Supervision of elderly multigravida, third trimester: Secondary | ICD-10-CM | POA: Insufficient documentation

## 2023-12-31 DIAGNOSIS — O0990 Supervision of high risk pregnancy, unspecified, unspecified trimester: Secondary | ICD-10-CM | POA: Insufficient documentation

## 2024-01-01 DIAGNOSIS — Z419 Encounter for procedure for purposes other than remedying health state, unspecified: Secondary | ICD-10-CM | POA: Diagnosis not present

## 2024-01-01 NOTE — L&D Delivery Note (Addendum)
 Allison Holmes is a 38 y.o. [id number] s/p SVD at [redacted]w[redacted]d. She was admitted for IOL 2/2 preE w/ severe features.   ROM:  Clear GBS Status: positive (01/13/24). Given PCN intrapartum. Maximum Maternal Temp: 98.50F  LABOR COURSE Initial SVE: 0/Thick/High.  She then progressed to complete.  Delivery Note Delivery Date/Time: 01/14/24 0138 Called to room and patient was complete and pushing. Head delivered ROA. No nuchal cord present. Shoulder and body delivered in usual fashion. Infant with spontaneous cry, placed on mother's abdomen, dried and stimulated. Cord clamped x 2 after 1-minute delay, and cut by support person. Cord blood drawn. Placenta delivered spontaneously with gentle cord traction. Appears intact with 3VC. Fundus firm with massage and Pitocin. Labia, perineum, vagina, and cervix inspected with no lacerations noted.   APGAR: 1 MIN: 8, 5 MIN: 9 Weight pending.   Cord: 3VC with the following complications: None.    Anesthesia:  Epidural Episiotomy: None Lacerations: None Est. Blood Loss (mL): 213  Mom to postpartum.  Baby to Couplet care / Skin to Skin.  Sundra Aland, MD 01/14/24 12:31 PM     Attestation of Supervision of Student:  I confirm that I have verified the information documented in the medical student's note and that I have also personally the delivery and was gloved and present for entirety of delivery.  I have verified that all services and findings are accurately documented in this student's note; and I agree with management and plan as outlined in the documentation. I have also made any necessary editorial changes.   Sundra Aland, MD Center for [facility], [facility] 01/18/2024 12:31 PM

## 2024-01-09 ENCOUNTER — Encounter: Payer: Medicaid Other | Admitting: Obstetrics & Gynecology

## 2024-01-13 ENCOUNTER — Inpatient Hospital Stay (HOSPITAL_COMMUNITY): Payer: Medicaid Other | Admitting: Anesthesiology

## 2024-01-13 ENCOUNTER — Encounter (HOSPITAL_COMMUNITY): Payer: Self-pay | Admitting: Family Medicine

## 2024-01-13 ENCOUNTER — Inpatient Hospital Stay (HOSPITAL_COMMUNITY): Payer: Medicaid Other

## 2024-01-13 ENCOUNTER — Inpatient Hospital Stay (HOSPITAL_COMMUNITY)
Admission: AD | Admit: 2024-01-13 | Discharge: 2024-01-16 | DRG: 807 | Disposition: A | Discharge status: Home / Self Care | Payer: Medicaid Other | Attending: Obstetrics and Gynecology | Admitting: Obstetrics and Gynecology

## 2024-01-13 ENCOUNTER — Other Ambulatory Visit: Payer: Self-pay

## 2024-01-13 DIAGNOSIS — O99824 Streptococcus B carrier state complicating childbirth: Secondary | ICD-10-CM | POA: Diagnosis present

## 2024-01-13 DIAGNOSIS — O1414 Severe pre-eclampsia complicating childbirth: Secondary | ICD-10-CM | POA: Diagnosis not present

## 2024-01-13 DIAGNOSIS — O3663X0 Maternal care for excessive fetal growth, third trimester, not applicable or unspecified: Secondary | ICD-10-CM | POA: Diagnosis not present

## 2024-01-13 DIAGNOSIS — O09523 Supervision of elderly multigravida, third trimester: Secondary | ICD-10-CM | POA: Diagnosis not present

## 2024-01-13 DIAGNOSIS — J810 Acute pulmonary edema: Secondary | ICD-10-CM | POA: Diagnosis not present

## 2024-01-13 DIAGNOSIS — O1410 Severe pre-eclampsia, unspecified trimester: Secondary | ICD-10-CM | POA: Diagnosis present

## 2024-01-13 DIAGNOSIS — R0981 Nasal congestion: Secondary | ICD-10-CM | POA: Diagnosis not present

## 2024-01-13 DIAGNOSIS — Z3A36 36 weeks gestation of pregnancy: Principal | ICD-10-CM

## 2024-01-13 DIAGNOSIS — R918 Other nonspecific abnormal finding of lung field: Secondary | ICD-10-CM | POA: Diagnosis not present

## 2024-01-13 DIAGNOSIS — Z1152 Encounter for screening for COVID-19: Secondary | ICD-10-CM | POA: Diagnosis not present

## 2024-01-13 DIAGNOSIS — O0990 Supervision of high risk pregnancy, unspecified, unspecified trimester: Secondary | ICD-10-CM

## 2024-01-13 DIAGNOSIS — O09529 Supervision of elderly multigravida, unspecified trimester: Secondary | ICD-10-CM

## 2024-01-13 DIAGNOSIS — O1413 Severe pre-eclampsia, third trimester: Secondary | ICD-10-CM

## 2024-01-13 DIAGNOSIS — O3660X0 Maternal care for excessive fetal growth, unspecified trimester, not applicable or unspecified: Secondary | ICD-10-CM | POA: Diagnosis present

## 2024-01-13 DIAGNOSIS — Z603 Acculturation difficulty: Secondary | ICD-10-CM | POA: Diagnosis present

## 2024-01-13 DIAGNOSIS — I10 Essential (primary) hypertension: Secondary | ICD-10-CM | POA: Diagnosis not present

## 2024-01-13 DIAGNOSIS — R0602 Shortness of breath: Secondary | ICD-10-CM | POA: Diagnosis not present

## 2024-01-13 LAB — COMPREHENSIVE METABOLIC PANEL
ALT: 11 U/L (ref 0–44)
ALT: 11 U/L (ref 0–44)
AST: 29 U/L (ref 15–41)
AST: 19 U/L (ref 15–41)
Albumin: 2.5 g/dL — ABNORMAL LOW (ref 3.5–5.0)
Albumin: 2.4 g/dL — ABNORMAL LOW (ref 3.5–5.0)
Alkaline Phosphatase: 266 U/L — ABNORMAL HIGH (ref 38–126)
Alkaline Phosphatase: 276 U/L — ABNORMAL HIGH (ref 38–126)
Anion gap: 9 (ref 5–15)
Anion gap: 12 (ref 5–15)
BUN: 5 mg/dL — ABNORMAL LOW (ref 6–20)
BUN: <5 mg/dL — ABNORMAL LOW (ref 6–20)
CO2: 21 mmol/L — ABNORMAL LOW (ref 22–32)
CO2: 17 mmol/L — ABNORMAL LOW (ref 22–32)
Calcium: 8.9 mg/dL (ref 8.9–10.3)
Calcium: 7.5 mg/dL — ABNORMAL LOW (ref 8.9–10.3)
Chloride: 107 mmol/L (ref 98–111)
Chloride: 103 mmol/L (ref 98–111)
Creatinine, Ser: 0.55 mg/dL (ref 0.44–1.00)
Creatinine, Ser: 0.57 mg/dL (ref 0.44–1.00)
GFR, Estimated: >60 mL/min (ref >60)
GFR, Estimated: >60 mL/min (ref >60)
Glucose, Bld: 70 mg/dL (ref 70–99)
Glucose, Bld: 74 mg/dL (ref 70–99)
Potassium: 3.7 mmol/L (ref 3.5–5.1)
Potassium: 3.3 mmol/L — ABNORMAL LOW (ref 3.5–5.1)
Sodium: 137 mmol/L (ref 135–145)
Sodium: 132 mmol/L — ABNORMAL LOW (ref 135–145)
Total Bilirubin: 0.6 mg/dL (ref 0.0–1.2)
Total Bilirubin: 0.5 mg/dL (ref 0.0–1.2)
Total Protein: 7 g/dL (ref 6.5–8.1)
Total Protein: 6.7 g/dL (ref 6.5–8.1)

## 2024-01-13 LAB — PROTEIN / CREATININE RATIO, URINE
Creatinine, Urine: 111 mg/dL
Protein Creatinine Ratio: 0.36 mg/mg{creat} — ABNORMAL HIGH (ref 0.00–0.15)
Total Protein, Urine: 40 mg/dL

## 2024-01-13 LAB — CBC
HCT: 34.9 % — ABNORMAL LOW (ref 36.0–46.0)
HCT: 34.3 % — ABNORMAL LOW (ref 36.0–46.0)
Hemoglobin: 11.5 g/dL — ABNORMAL LOW (ref 12.0–15.0)
Hemoglobin: 11.1 g/dL — ABNORMAL LOW (ref 12.0–15.0)
MCH: 26 pg (ref 26.0–34.0)
MCH: 25.9 pg — ABNORMAL LOW (ref 26.0–34.0)
MCHC: 33 g/dL (ref 30.0–36.0)
MCHC: 32.4 g/dL (ref 30.0–36.0)
MCV: 79 fL — ABNORMAL LOW (ref 80.0–100.0)
MCV: 80 fL (ref 80.0–100.0)
Platelets: 258 10*3/uL (ref 150–400)
Platelets: 228 10*3/uL (ref 150–400)
RBC: 4.42 MIL/uL (ref 3.87–5.11)
RBC: 4.29 MIL/uL (ref 3.87–5.11)
RDW: 15.6 % — ABNORMAL HIGH (ref 11.5–15.5)
RDW: 15.7 % — ABNORMAL HIGH (ref 11.5–15.5)
WBC: 10.1 10*3/uL (ref 4.0–10.5)
WBC: 10.5 10*3/uL (ref 4.0–10.5)
nRBC: 0 % (ref 0.0–0.2)
nRBC: 0 % (ref 0.0–0.2)

## 2024-01-13 LAB — MAGNESIUM: Magnesium: 4.5 mg/dL — ABNORMAL HIGH (ref 1.7–2.4)

## 2024-01-13 LAB — TYPE AND SCREEN
ABO/RH(D): B POS
Antibody Screen: NEGATIVE

## 2024-01-13 LAB — TROPONIN I (HIGH SENSITIVITY): Troponin I (High Sensitivity): 7 ng/L (ref <18)

## 2024-01-13 LAB — GROUP B STREP BY PCR: Group B strep by PCR: POSITIVE — AB

## 2024-01-13 LAB — BRAIN NATRIURETIC PEPTIDE: B Natriuretic Peptide: 283.6 pg/mL — ABNORMAL HIGH (ref 0.0–100.0)

## 2024-01-13 MED ORDER — TERBUTALINE SULFATE 1 MG/ML IJ SOLN: 0.2500 mg | Freq: Once | INTRAMUSCULAR | Status: DC | PRN

## 2024-01-13 MED ORDER — LABETALOL HCL 5 MG/ML IV SOLN
80.0000 mg | INTRAVENOUS | Status: DC | PRN
Start: 2024-01-13 — End: 2024-01-13

## 2024-01-13 MED ORDER — MISOPROSTOL 25 MCG QUARTER TABLET
25.0000 ug | ORAL_TABLET | Freq: Once | ORAL | Status: AC
  Administered 2024-01-13: 25 ug via VAGINAL
  Filled 2024-01-13: qty 1

## 2024-01-13 MED ORDER — OXYCODONE-ACETAMINOPHEN 5-325 MG PO TABS: 2.0000 | ORAL_TABLET | ORAL | Status: DC | PRN

## 2024-01-13 MED ORDER — MISOPROSTOL 50MCG HALF TABLET
50.0000 ug | ORAL_TABLET | Freq: Once | ORAL | Status: AC
  Administered 2024-01-13: 50 ug via ORAL
  Filled 2024-01-13: qty 1

## 2024-01-13 MED ORDER — LACTATED RINGERS IV SOLN: INTRAVENOUS | Status: DC

## 2024-01-13 MED ORDER — HYDRALAZINE HCL 20 MG/ML IJ SOLN
10.0000 mg | INTRAMUSCULAR | Status: DC | PRN
Start: 2024-01-13 — End: 2024-01-13

## 2024-01-13 MED ORDER — SOD CITRATE-CITRIC ACID 500-334 MG/5ML PO SOLN: 30.0000 mL | ORAL | Status: DC | PRN

## 2024-01-13 MED ORDER — DIPHENHYDRAMINE HCL 50 MG/ML IJ SOLN: 12.5000 mg | INTRAMUSCULAR | Status: DC | PRN

## 2024-01-13 MED ORDER — FENTANYL CITRATE (PF) 100 MCG/2ML IJ SOLN
50.0000 ug | INTRAMUSCULAR | Status: DC | PRN
  Administered 2024-01-13 (×2): 100 ug via INTRAVENOUS
  Filled 2024-01-13 (×2): qty 2

## 2024-01-13 MED ORDER — LABETALOL HCL 5 MG/ML IV SOLN
20.0000 mg | INTRAVENOUS | Status: DC | PRN
  Administered 2024-01-13: 20 mg via INTRAVENOUS
  Filled 2024-01-13: qty 4

## 2024-01-13 MED ORDER — HYDRALAZINE HCL 20 MG/ML IJ SOLN: 10.0000 mg | INTRAMUSCULAR | Status: DC | PRN

## 2024-01-13 MED ORDER — LABETALOL HCL 5 MG/ML IV SOLN: 80.0000 mg | INTRAVENOUS | Status: DC | PRN

## 2024-01-13 MED ORDER — EPHEDRINE 5 MG/ML INJ: 10.0000 mg | INTRAVENOUS | Status: DC | PRN

## 2024-01-13 MED ORDER — OXYTOCIN-SODIUM CHLORIDE 30-0.9 UT/500ML-% IV SOLN: 1.0000 m[IU]/min | INTRAVENOUS | Status: DC

## 2024-01-13 MED ORDER — ONDANSETRON HCL 4 MG/2ML IJ SOLN
4.0000 mg | Freq: Four times a day (QID) | INTRAMUSCULAR | Status: DC | PRN
  Administered 2024-01-14: 4 mg via INTRAVENOUS
  Filled 2024-01-13: qty 2

## 2024-01-13 MED ORDER — ACETAMINOPHEN 325 MG PO TABS: 650.0000 mg | ORAL_TABLET | ORAL | Status: DC | PRN

## 2024-01-13 MED ORDER — PENICILLIN G POT IN DEXTROSE 60000 UNIT/ML IV SOLN
3.0000 10*6.[IU] | INTRAVENOUS | Status: DC
  Administered 2024-01-13: 3 10*6.[IU] via INTRAVENOUS
  Filled 2024-01-13 (×4): qty 50

## 2024-01-13 MED ORDER — PHENYLEPHRINE 80 MCG/ML (10ML) SYRINGE FOR IV PUSH (FOR BLOOD PRESSURE SUPPORT): 80.0000 ug | PREFILLED_SYRINGE | INTRAVENOUS | Status: DC | PRN

## 2024-01-13 MED ORDER — MAGNESIUM SULFATE 40 GM/1000ML IV SOLN: 2.0000 g/h | INTRAVENOUS | Status: DC

## 2024-01-13 MED ORDER — LACTATED RINGERS IV SOLN: INTRAVENOUS | Status: AC

## 2024-01-13 MED ORDER — FENTANYL-BUPIVACAINE-NACL 0.5-0.125-0.9 MG/250ML-% EP SOLN
12.0000 mL/h | EPIDURAL | Status: DC | PRN
  Administered 2024-01-13: 12 mL/h via EPIDURAL
  Filled 2024-01-13: qty 250

## 2024-01-13 MED ORDER — OXYCODONE-ACETAMINOPHEN 5-325 MG PO TABS: 1.0000 | ORAL_TABLET | ORAL | Status: DC | PRN

## 2024-01-13 MED ORDER — FUROSEMIDE 10 MG/ML IJ SOLN
20.0000 mg | Freq: Four times a day (QID) | INTRAMUSCULAR | Status: DC
  Administered 2024-01-13 – 2024-01-14 (×2): 20 mg via INTRAVENOUS
  Filled 2024-01-13 (×2): qty 2

## 2024-01-13 MED ORDER — LABETALOL HCL 5 MG/ML IV SOLN: 40.0000 mg | INTRAVENOUS | Status: DC | PRN

## 2024-01-13 MED ORDER — SODIUM CHLORIDE 0.9 % IV SOLN
5.0000 10*6.[IU] | Freq: Once | INTRAVENOUS | Status: AC
  Administered 2024-01-13: 5 10*6.[IU] via INTRAVENOUS
  Filled 2024-01-13: qty 5

## 2024-01-13 MED ORDER — LIDOCAINE HCL (PF) 1 % IJ SOLN
INTRAMUSCULAR | Status: DC | PRN
  Administered 2024-01-13 (×2): 4 mL via EPIDURAL

## 2024-01-13 MED ORDER — OXYTOCIN BOLUS FROM INFUSION
333.0000 mL | Freq: Once | INTRAVENOUS | Status: AC
  Administered 2024-01-14: 333 mL via INTRAVENOUS

## 2024-01-13 MED ORDER — LABETALOL HCL 5 MG/ML IV SOLN
40.0000 mg | INTRAVENOUS | Status: DC | PRN
Start: 2024-01-13 — End: 2024-01-13

## 2024-01-13 MED ORDER — OXYTOCIN-SODIUM CHLORIDE 30-0.9 UT/500ML-% IV SOLN
2.5000 [IU]/h | INTRAVENOUS | Status: DC
  Filled 2024-01-13: qty 500

## 2024-01-13 MED ORDER — LACTATED RINGERS IV SOLN: 500.0000 mL | INTRAVENOUS | Status: DC | PRN

## 2024-01-13 MED ORDER — LABETALOL HCL 5 MG/ML IV SOLN
20.0000 mg | INTRAVENOUS | Status: DC | PRN
Start: 2024-01-13 — End: 2024-01-13

## 2024-01-13 MED ORDER — LACTATED RINGERS IV SOLN
500.0000 mL | Freq: Once | INTRAVENOUS | Status: AC
  Administered 2024-01-13: 500 mL via INTRAVENOUS

## 2024-01-13 MED ORDER — LIDOCAINE HCL (PF) 1 % IJ SOLN: 30.0000 mL | INTRAMUSCULAR | Status: DC | PRN

## 2024-01-13 MED ORDER — MAGNESIUM SULFATE BOLUS VIA INFUSION
6.0000 g | Freq: Once | INTRAVENOUS | Status: AC
  Administered 2024-01-13: 6 g via INTRAVENOUS
  Filled 2024-01-13: qty 1000

## 2024-01-13 MED ORDER — FUROSEMIDE 20 MG PO TABS
20.0000 mg | ORAL_TABLET | Freq: Every day | ORAL | Status: DC
  Administered 2024-01-13: 20 mg via ORAL
  Filled 2024-01-13: qty 1

## 2024-01-13 NOTE — Progress Notes (Signed)
 Labor Progress Note Allison Holmes is a 38 y.o. H5E6996 at [redacted]w[redacted]d presented for IOL 2/2 preE w/ severe features. S: Epidural in place, comfortable.  O:  BP (!) 140/74   Pulse 99   Temp 98.3 F (36.8 C) (Oral)   Resp 15   Ht 5' 7 (1.702 m)   Wt 85.7 kg   LMP 05/12/2023   SpO2 99%   BMI 29.60 kg/m  EFM: 130 bpm/variability: mod/-accels, - decels, Category I  CVE: Dilation: 3 Effacement (%): 60 Station: -3 Presentation: Vertex Exam by:: Dr Von   A&P: 38 y.o. H5E6996 [redacted]w[redacted]d here for IOL in setting of preE w/ SF. #Labor: Discussed role of AROM in detail w pt, pt consented verbally. AROM performed w moderate amount of clear fluid. Plan to start Pitocin  in 30 min if ctx are not regular. #Pain: Epidural #FWB: Category II, reassuring #GBS  positive. Given intrapartum PCN. #PreE w/ SF: On Mag and Lasix  20 IV q6  BP normotensive to mild range #Pulm Edema: No O2 requirement. Strict I's&O's. Getting Lasix . Proceed with delivery.   Alain Von, MD 11:38 PM

## 2024-01-13 NOTE — Progress Notes (Signed)
 Labor Progress Note Allison Holmes is a 38 y.o. H5E6996 at [redacted]w[redacted]d presented for IOL 2/2 preE w/ severe features. S: Reports moderate pain, requesting epidural.  O:  BP 133/60   Pulse 93   Temp 98.3 F (36.8 C) (Oral)   Resp 15   Ht 5' 7 (1.702 m)   Wt 85.7 kg   LMP 05/12/2023   SpO2 95%   BMI 29.60 kg/m  EFM: 125 bpm/variability: mod/-accels, - decels, Category I  CVE: Dilation: Closed Effacement (%): Thick Presentation: Vertex Exam by:: Dr. Nicholaus   A&P: 38 y.o. H5E6996 [redacted]w[redacted]d here for IOL in setting of preE w/ SF. #Labor: Cervix closed. Dual cytotec . Patient amenable to FB, AROM, pit as needed. Plan to recheck cx around 1130. #Pain: Epidural #FWB: Category I #GBS  positive. Given intrapartum PCN. #PreE w/ SF: Received labetalol  at 1600 for SBP 163. BPs since mild-mod range. Receiving continuous Mg 2g/h and Lasix  20 IV q6h. PRN antihypertensives. #Pulm Edema: No O2 requirement. Strict I's&O's. Getting Lasix . Proceed with delivery.   Creighton DELENA Flesher, Medical Student 9:56 PM   Attestation of Supervision of Student:  I confirm that I have verified the information documented in the medical student's note and that I have also personally reperformed the history, physical exam and all medical decision making activities.  I have verified that all services and findings are accurately documented in this student's note; and I agree with management and plan as outlined in the documentation. I have also made any necessary editorial changes.  Alain Sor, MD Center for [facility], [facility] 01/13/2024 10:13 PM

## 2024-01-13 NOTE — MAU Provider Note (Signed)
 LBP and lower abdominal pain    S Allison Holmes is a 38 y.o. [phone] pregnant female at [redacted]w[redacted]d who presents to MAU today with complaint of LBP and lower abdominal pain associated with new facial puffiness and subjective SOB and CP.  Found to have new elevated Bps in MAU as well.  Denies HA, RUQ pain.  Again has new subjective swelling and chest discomfort.    Receives care at [facility]. Prenatal records reviewed.  Pertinent items noted in HPI and remainder of comprehensive ROS otherwise negative.   O LMP 05/12/2023  Physical Exam Vitals and nursing note reviewed.  Constitutional:      General: She is not in acute distress.    Appearance: She is well-developed. She is not ill-appearing.  HENT:     Head: Normocephalic and atraumatic.     Comments: She reports new facial puffiness but symmetric on my exam, unknown baseline    Right Ear: External ear normal.     Left Ear: External ear normal.     Nose: Nose normal.     Mouth/Throat:     Mouth: Mucous membranes are moist.     Pharynx: Oropharynx is clear.  Eyes:     Extraocular Movements: Extraocular movements intact.     Conjunctiva/sclera: Conjunctivae normal.  Cardiovascular:     Rate and Rhythm: Normal rate.  Pulmonary:     Effort: Pulmonary effort is normal. No respiratory distress.  Abdominal:     Comments: gravid  Musculoskeletal:        General: Normal range of motion.     Cervical back: Normal range of motion.  Skin:    General: Skin is warm and dry.  Neurological:     Mental Status: She is alert and oriented to person, place, and time. Mental status is at baseline.     Motor: No weakness.     Gait: Gait normal.  Psychiatric:        Mood and Affect: Mood normal.        Behavior: Behavior normal.    EKG = normal sinus rhythm, normal axis, noted to have Q wave in III  NST: 145 bpm, moderate variability, +accels, no decels, rare ctx   MDM: MAU Course:  Has had three severe range pressures in MAU.  Will start  Labetalol  protocol while working up Chest Pain. Concern for pulmonary edema in s/o preE with severe features. Also started Magnesium .  CXR prelim with ?pulmonary edema, final read pending at time of admission  BNP 283.6* HS Troponin 7 CBC plts 258, Hgb 11.5 CMP Cr 0.55, LFTs normal, Alk phos 266 UPC 0.36*  Called first call attending for admission prefers w/u for CP be completed prior to admission. W/u as above concerning for Severe PreE c/b ?pulmonary edema.  Will give 20mg  IV Lasix .  Attending agreeable with admission at this time.   A/P: #[redacted] weeks gestation  #Severe PreE Admit   Jhonny Augustin BROCKS, MD 01/13/2024 3:10 PM

## 2024-01-13 NOTE — H&P (Signed)
 LABOR AND DELIVERY ADMISSION HISTORY AND PHYSICAL NOTE  Allison Holmes is a 38 y.o. female [phone] with IUP at [redacted]w[redacted]d presenting for HBP, SOB, CP. Found to have preE w/SF.   Patient reports the fetal movement as active. Patient reports uterine contraction activity as rare. Patient reports vaginal bleeding as none. Patient describes fluid per vagina as None.   Patient endorses chest pain, SOB, HA.  She plans on breast feeding. Her contraception plan is:  Nexplanon  .  Prenatal History/Complications: PNC at [facility] - AMA - LGA  Sono:  @[redacted]w[redacted]d , CWD, normal anatomy, cephalic presentation, posterior fundal placenta, 92%ile, EFW 2941. AC 90%  Pregnancy complications:  Patient Active Problem List   Diagnosis Date Noted   Severe pre-eclampsia 01/13/2024   Large for gestational age fetus affecting management of mother, antepartum 12/27/2023   Supervision of high risk pregnancy, antepartum 09/18/2023   Advanced maternal age in multigravida 09/18/2023   Language barrier affecting health care 03/22/2018    Past Medical History: Past Medical History:  Diagnosis Date   Medical history non-contributory     Past Surgical History: Past Surgical History:  Procedure Laterality Date   NO PAST SURGERIES      Obstetrical History: OB History     Gravida  4   Para  3   Term  3   Preterm  0   AB  0   Living  3      SAB  0   IAB  0   Ectopic  0   Multiple  0   Live Births  3           Social History: Social History   Socioeconomic History   Marital status: Married    Spouse name: Not on file   Number of children: Not on file   Years of education: Not on file   Highest education level: Not on file  Occupational History   Not on file  Tobacco Use   Smoking status: Never    Passive exposure: Never   Smokeless tobacco: Never  Vaping Use   Vaping status: Never Used  Substance and Sexual Activity   Alcohol use: No   Drug use: Never   Sexual activity: Yes  Other  Topics Concern   Not on file  Social History Narrative   Not on file   Social Drivers of Health   Financial Resource Strain: Not on file  Food Insecurity: No Food Insecurity (01/13/2024)   Hunger Vital Sign    Worried About Running Out of Food in the Last Year: Never true    Ran Out of Food in the Last Year: Never true  Transportation Needs: No Transportation Needs (01/13/2024)   PRAPARE - Administrator, Civil Service (Medical): No    Lack of Transportation (Non-Medical): No  Physical Activity: Not on file  Stress: Not on file  Social Connections: Not on file    Family History: Family History  Problem Relation Age of Onset   Cancer Neg Hx    Diabetes Neg Hx    Hypertension Neg Hx     Allergies: Allergies  Allergen Reactions   Pork-Derived Products     Medications Prior to Admission  Medication Sig Dispense Refill Last Dose/Taking   Doxylamine -Pyridoxine  (DICLEGIS ) 10-10 MG TBEC Take 2 tabs at nighttime. Can take 1 tab at breakfast and 1 tab in afternoon 100 tablet 2    Ferric Maltol  30 MG CAPS Take 1 capsule (30 mg total) by mouth every  other day. 15 capsule 5    fluticasone  (FLONASE ) 50 MCG/ACT nasal spray Place 1 spray into both nostrils in the morning and at bedtime. 48 mL 0    pantoprazole  (PROTONIX ) 40 MG tablet Take 1 tablet (40 mg total) by mouth daily. 30 tablet 2    Prenat-Fe Poly-Methfol-FA-DHA (VITAFOL  ULTRA) 29-0.6-0.4-200 MG CAPS Take 1 capsule by mouth daily. 30 capsule 11    vitamin C (ASCORBIC ACID) 250 MG tablet Take 250 mg by mouth daily.        Review of Systems  All systems reviewed and negative except as stated in HPI  Physical Exam BP 126/70   Pulse 85   Temp 98.3 F (36.8 C) (Oral)   Resp 15   Ht 5' 7 (1.702 m)   Wt 85.7 kg   LMP 05/12/2023   SpO2 95%   BMI 29.60 kg/m   Physical Exam Constitutional:      General: She is not in acute distress.    Appearance: She is ill-appearing.  Cardiovascular:     Rate and Rhythm:  Normal rate and regular rhythm.  Pulmonary:     Effort: Pulmonary effort is normal.  Abdominal:     Comments: Gravid  Genitourinary:    Vagina: Normal.  Skin:    General: Skin is warm and dry.  Neurological:     General: No focal deficit present.  Psychiatric:        Mood and Affect: Mood normal.   Presentation: cephalic by BSUS  Fetal monitoring: Baseline: 130 bpm, Variability: Good {> 6 bpm), Accelerations: Reactive, and Decelerations: Absent Uterine activity: rare  Dilation: Closed Effacement (%): Thick Presentation: Vertex Exam by:: Dr. Nicholaus  Prenatal labs: ABO, Rh: --/--/B POS (01/13 1558) Antibody: NEG (01/13 1558) Rubella: >33.00 (09/18 1029) RPR: Non Reactive (11/15 0906)  HBsAg: Negative (09/18 1029)  HIV: Non Reactive (11/15 0906)  GC/Chlamydia:  Neisseria Gonorrhea  Date Value Ref Range Status  07/27/2023 Negative  Final   Chlamydia  Date Value Ref Range Status  07/27/2023 Negative  Final   GBS:   unknown  Prenatal Transfer Tool  Maternal Diabetes: No Genetic Screening: Normal Maternal Ultrasounds/Referrals: None Fetal Ultrasounds or other Referrals:  Other:  LGA Maternal Substance Abuse:  No Significant Maternal Medications:  None Significant Maternal Lab Results: None  Results for orders placed or performed during the hospital encounter of 01/13/24 (from the past 24 hours)  Protein / creatinine ratio, urine   Collection Time: 01/13/24  3:25 PM  Result Value Ref Range   Creatinine, Urine 111 mg/dL   Total Protein, Urine 40 mg/dL   Protein Creatinine Ratio 0.36 (H) 0.00 - 0.15 mg/mg[Cre]  Type and screen [facility]   Collection Time: 01/13/24  3:58 PM  Result Value Ref Range   ABO/RH(D) B POS    Antibody Screen NEG    Sample Expiration      01/16/2024,2359 Performed at [facility], [address] [place], [place] [address]   CBC   Collection Time: 01/13/24  4:01 PM  Result Value Ref Range   WBC 10.1 4.0 - 10.5  K/uL   RBC 4.42 3.87 - 5.11 MIL/uL   Hemoglobin 11.5 (L) 12.0 - 15.0 g/dL   HCT 65.0 (L) 63.9 - 53.9 %   MCV 79.0 (L) 80.0 - 100.0 fL   MCH 26.0 26.0 - 34.0 pg   MCHC 33.0 30.0 - 36.0 g/dL   RDW 84.3 (H) 88.4 - 84.4 %  Platelets 258 150 - 400 K/uL   nRBC 0.0 0.0 - 0.2 %  Comprehensive metabolic panel   Collection Time: 01/13/24  4:01 PM  Result Value Ref Range   Sodium 137 135 - 145 mmol/L   Potassium 3.7 3.5 - 5.1 mmol/L   Chloride 107 98 - 111 mmol/L   CO2 21 (L) 22 - 32 mmol/L   Glucose, Bld 70 70 - 99 mg/dL   BUN 5 (L) 6 - 20 mg/dL   Creatinine, Ser 9.44 0.44 - 1.00 mg/dL   Calcium  8.9 8.9 - 10.3 mg/dL   Total Protein 7.0 6.5 - 8.1 g/dL   Albumin 2.5 (L) 3.5 - 5.0 g/dL   AST 29 15 - 41 U/L   ALT 11 0 - 44 U/L   Alkaline Phosphatase 266 (H) 38 - 126 U/L   Total Bilirubin 0.6 0.0 - 1.2 mg/dL   GFR, Estimated >39 >39 mL/min   Anion gap 9 5 - 15  Brain natriuretic peptide   Collection Time: 01/13/24  4:01 PM  Result Value Ref Range   B Natriuretic Peptide 283.6 (H) 0.0 - 100.0 pg/mL  Troponin I (High Sensitivity)   Collection Time: 01/13/24  4:01 PM  Result Value Ref Range   Troponin I (High Sensitivity) 7 <18 ng/L    Assessment: Kamaree Pereida is a 38 y.o. [id number] at [redacted]w[redacted]d here for preE w/SF at 36.3 weeks. Will induce.  #Labor: Cervix closed. Dual cytotec . Patient amenable to FB, AROM, pit as needed. #Pain: IV pain meds PRN, epidural upon request #FHT: Category I #GBS/ID: Culture pending > PCN given preterm #MOF: breast feeding #MOC:  Nexplanon   #preE w/SF: abnormal P:C, BP, pulmonary edema. Mag 6g bolus/2g per hour. PRN antihypertensives. #Pulmonary edema. No current O2 requirement. S/p lasix  20 mg. Strict I's&O's. Proceed with delivery.  Almarie CHRISTELLA Moats, MD [facility] Fellow Center for [facility], [facility]  01/13/2024, 7:50 PM

## 2024-01-13 NOTE — Anesthesia Preprocedure Evaluation (Signed)
 Anesthesia Evaluation  Patient identified by MRN, date of birth, ID band Patient awake    Reviewed: Allergy & Precautions, Patient's Chart, lab work & pertinent test results  History of Anesthesia Complications Negative for: history of anesthetic complications  Airway Mallampati: II  TM Distance: >3 FB Neck ROM: Full    Dental no notable dental hx.    Pulmonary shortness of breath   Pulmonary exam normal        Cardiovascular Normal cardiovascular exam     Neuro/Psych negative neurological ROS     GI/Hepatic negative GI ROS, Neg liver ROS,,,  Endo/Other  negative endocrine ROS    Renal/GU negative Renal ROS     Musculoskeletal negative musculoskeletal ROS (+)    Abdominal   Peds  Hematology negative hematology ROS (+)   Anesthesia Other Findings Day of surgery medications reviewed with patient.  Reproductive/Obstetrics (+) Pregnancy (severe preE on Mg)                             Anesthesia Physical Anesthesia Plan  ASA: 3  Anesthesia Plan: Epidural   Post-op Pain Management:    Induction:   PONV Risk Score and Plan: Treatment may vary due to age or medical condition  Airway Management Planned: Natural Airway  Additional Equipment: Fetal Monitoring  Intra-op Plan:   Post-operative Plan:   Informed Consent: I have reviewed the patients History and Physical, chart, labs and discussed the procedure including the risks, benefits and alternatives for the proposed anesthesia with the patient or authorized representative who has indicated his/her understanding and acceptance.     Interpreter used for interview  Plan Discussed with:   Anesthesia Plan Comments:        Anesthesia Quick Evaluation

## 2024-01-13 NOTE — Anesthesia Procedure Notes (Signed)
 Epidural Patient location during procedure: OB Start time: 01/13/2024 10:30 PM End time: 01/13/2024 10:39 PM  Staffing Anesthesiologist: Paul Lamarr BRAVO, MD Performed: anesthesiologist   Preanesthetic Checklist Completed: patient identified, IV checked, risks and benefits discussed, monitors and equipment checked, pre-op evaluation and timeout performed  Epidural Patient position: sitting Prep: DuraPrep and site prepped and draped Patient monitoring: continuous pulse ox, blood pressure and heart rate Approach: midline Location: L3-L4 Injection technique: LOR air  Needle:  Needle type: Tuohy  Needle gauge: 17 G Needle length: 9 cm Needle insertion depth: 5.5 cm Catheter type: closed end flexible Catheter size: 19 Gauge Catheter at skin depth: 10 cm Test dose: negative and Other (1% lidocaine )  Assessment Events: blood not aspirated, no cerebrospinal fluid, injection not painful, no injection resistance, no paresthesia and negative IV test  Additional Notes Patient identified. Risks, benefits, and alternatives discussed with patient including but not limited to bleeding, infection, nerve damage, paralysis, failed block, incomplete pain control, headache, blood pressure changes, nausea, vomiting, reactions to medication, itching, and postpartum back pain. Confirmed with bedside nurse the patient's most recent platelet count. Confirmed with patient that they are not currently taking any anticoagulation, have any bleeding history, or any family history of bleeding disorders. Patient expressed understanding and wished to proceed. All questions were answered. Sterile technique was used throughout the entire procedure. Please see nursing notes for vital signs.   Crisp LOR on 3rd attempt, some difficulty due to inadequate patient positioning/language barrier. Test dose was given through epidural catheter and negative prior to continuing to dose epidural or start infusion. Warning signs of  high block given to the patient including shortness of breath, tingling/numbness in hands, complete motor block, or any concerning symptoms with instructions to call for help. Patient was given instructions on fall risk and not to get out of bed. All questions and concerns addressed with instructions to call with any issues or inadequate analgesia.  Reason for block:procedure for pain

## 2024-01-14 ENCOUNTER — Encounter (HOSPITAL_COMMUNITY): Payer: Self-pay | Admitting: Family Medicine

## 2024-01-14 DIAGNOSIS — O1414 Severe pre-eclampsia complicating childbirth: Secondary | ICD-10-CM | POA: Diagnosis not present

## 2024-01-14 DIAGNOSIS — O09523 Supervision of elderly multigravida, third trimester: Secondary | ICD-10-CM | POA: Diagnosis not present

## 2024-01-14 DIAGNOSIS — Z3A36 36 weeks gestation of pregnancy: Secondary | ICD-10-CM | POA: Diagnosis not present

## 2024-01-14 DIAGNOSIS — O3663X0 Maternal care for excessive fetal growth, third trimester, not applicable or unspecified: Secondary | ICD-10-CM | POA: Diagnosis not present

## 2024-01-14 LAB — COMPREHENSIVE METABOLIC PANEL
ALT: 12 U/L (ref 0–44)
ALT: 11 U/L (ref 0–44)
ALT: 11 U/L (ref 0–44)
ALT: 12 U/L (ref 0–44)
AST: 19 U/L (ref 15–41)
AST: 18 U/L (ref 15–41)
AST: 21 U/L (ref 15–41)
AST: 18 U/L (ref 15–41)
Albumin: 2.4 g/dL — ABNORMAL LOW (ref 3.5–5.0)
Albumin: 2 g/dL — ABNORMAL LOW (ref 3.5–5.0)
Albumin: 2.2 g/dL — ABNORMAL LOW (ref 3.5–5.0)
Albumin: 2 g/dL — ABNORMAL LOW (ref 3.5–5.0)
Alkaline Phosphatase: 242 U/L — ABNORMAL HIGH (ref 38–126)
Alkaline Phosphatase: 227 U/L — ABNORMAL HIGH (ref 38–126)
Alkaline Phosphatase: 192 U/L — ABNORMAL HIGH (ref 38–126)
Alkaline Phosphatase: 178 U/L — ABNORMAL HIGH (ref 38–126)
Anion gap: 10 (ref 5–15)
Anion gap: 9 (ref 5–15)
Anion gap: 11 (ref 5–15)
Anion gap: 10 (ref 5–15)
BUN: <5 mg/dL — ABNORMAL LOW (ref 6–20)
BUN: <5 mg/dL — ABNORMAL LOW (ref 6–20)
BUN: 5 mg/dL — ABNORMAL LOW (ref 6–20)
BUN: <5 mg/dL — ABNORMAL LOW (ref 6–20)
CO2: 20 mmol/L — ABNORMAL LOW (ref 22–32)
CO2: 23 mmol/L (ref 22–32)
CO2: 22 mmol/L (ref 22–32)
CO2: 24 mmol/L (ref 22–32)
Calcium: 7.4 mg/dL — ABNORMAL LOW (ref 8.9–10.3)
Calcium: 7.4 mg/dL — ABNORMAL LOW (ref 8.9–10.3)
Calcium: 7.1 mg/dL — ABNORMAL LOW (ref 8.9–10.3)
Calcium: 6.9 mg/dL — ABNORMAL LOW (ref 8.9–10.3)
Chloride: 103 mmol/L (ref 98–111)
Chloride: 105 mmol/L (ref 98–111)
Chloride: 103 mmol/L (ref 98–111)
Chloride: 103 mmol/L (ref 98–111)
Creatinine, Ser: 0.61 mg/dL (ref 0.44–1.00)
Creatinine, Ser: 0.52 mg/dL (ref 0.44–1.00)
Creatinine, Ser: 0.64 mg/dL (ref 0.44–1.00)
Creatinine, Ser: 0.72 mg/dL (ref 0.44–1.00)
GFR, Estimated: >60 mL/min (ref >60)
GFR, Estimated: >60 mL/min (ref >60)
GFR, Estimated: >60 mL/min (ref >60)
GFR, Estimated: >60 mL/min (ref >60)
Glucose, Bld: 112 mg/dL — ABNORMAL HIGH (ref 70–99)
Glucose, Bld: 97 mg/dL (ref 70–99)
Glucose, Bld: 89 mg/dL (ref 70–99)
Glucose, Bld: 109 mg/dL — ABNORMAL HIGH (ref 70–99)
Potassium: 3.3 mmol/L — ABNORMAL LOW (ref 3.5–5.1)
Potassium: 3.3 mmol/L — ABNORMAL LOW (ref 3.5–5.1)
Potassium: 3.5 mmol/L (ref 3.5–5.1)
Potassium: 3.5 mmol/L (ref 3.5–5.1)
Sodium: 133 mmol/L — ABNORMAL LOW (ref 135–145)
Sodium: 137 mmol/L (ref 135–145)
Sodium: 136 mmol/L (ref 135–145)
Sodium: 137 mmol/L (ref 135–145)
Total Bilirubin: 0.4 mg/dL (ref 0.0–1.2)
Total Bilirubin: 0.3 mg/dL (ref 0.0–1.2)
Total Bilirubin: 0.4 mg/dL (ref 0.0–1.2)
Total Bilirubin: 0.3 mg/dL (ref 0.0–1.2)
Total Protein: 6.7 g/dL (ref 6.5–8.1)
Total Protein: 5.8 g/dL — ABNORMAL LOW (ref 6.5–8.1)
Total Protein: 6.2 g/dL — ABNORMAL LOW (ref 6.5–8.1)
Total Protein: 5.9 g/dL — ABNORMAL LOW (ref 6.5–8.1)

## 2024-01-14 LAB — CBC
HCT: 34.7 % — ABNORMAL LOW (ref 36.0–46.0)
HCT: 31.2 % — ABNORMAL LOW (ref 36.0–46.0)
HCT: 32 % — ABNORMAL LOW (ref 36.0–46.0)
HCT: 30.1 % — ABNORMAL LOW (ref 36.0–46.0)
Hemoglobin: 11.3 g/dL — ABNORMAL LOW (ref 12.0–15.0)
Hemoglobin: 10.2 g/dL — ABNORMAL LOW (ref 12.0–15.0)
Hemoglobin: 10.5 g/dL — ABNORMAL LOW (ref 12.0–15.0)
Hemoglobin: 9.8 g/dL — ABNORMAL LOW (ref 12.0–15.0)
MCH: 25.7 pg — ABNORMAL LOW (ref 26.0–34.0)
MCH: 26.1 pg (ref 26.0–34.0)
MCH: 25.9 pg — ABNORMAL LOW (ref 26.0–34.0)
MCH: 26.1 pg (ref 26.0–34.0)
MCHC: 32.6 g/dL (ref 30.0–36.0)
MCHC: 32.7 g/dL (ref 30.0–36.0)
MCHC: 32.8 g/dL (ref 30.0–36.0)
MCHC: 32.6 g/dL (ref 30.0–36.0)
MCV: 79 fL — ABNORMAL LOW (ref 80.0–100.0)
MCV: 79.8 fL — ABNORMAL LOW (ref 80.0–100.0)
MCV: 79 fL — ABNORMAL LOW (ref 80.0–100.0)
MCV: 80.1 fL (ref 80.0–100.0)
Platelets: 264 10*3/uL (ref 150–400)
Platelets: 230 10*3/uL (ref 150–400)
Platelets: 242 10*3/uL (ref 150–400)
Platelets: 257 10*3/uL (ref 150–400)
RBC: 4.39 MIL/uL (ref 3.87–5.11)
RBC: 3.91 MIL/uL (ref 3.87–5.11)
RBC: 4.05 MIL/uL (ref 3.87–5.11)
RBC: 3.76 MIL/uL — ABNORMAL LOW (ref 3.87–5.11)
RDW: 15.8 % — ABNORMAL HIGH (ref 11.5–15.5)
RDW: 15.7 % — ABNORMAL HIGH (ref 11.5–15.5)
RDW: 15.9 % — ABNORMAL HIGH (ref 11.5–15.5)
RDW: 16.1 % — ABNORMAL HIGH (ref 11.5–15.5)
WBC: 11.7 10*3/uL — ABNORMAL HIGH (ref 4.0–10.5)
WBC: 10.3 10*3/uL (ref 4.0–10.5)
WBC: 10.3 10*3/uL (ref 4.0–10.5)
WBC: 9.4 10*3/uL (ref 4.0–10.5)
nRBC: 0 % (ref 0.0–0.2)
nRBC: 0 % (ref 0.0–0.2)
nRBC: 0 % (ref 0.0–0.2)
nRBC: 0 % (ref 0.0–0.2)

## 2024-01-14 LAB — MAGNESIUM
Magnesium: 5.1 mg/dL — ABNORMAL HIGH (ref 1.7–2.4)
Magnesium: 5.2 mg/dL — ABNORMAL HIGH (ref 1.7–2.4)
Magnesium: 5.4 mg/dL — ABNORMAL HIGH (ref 1.7–2.4)
Magnesium: 5 mg/dL — ABNORMAL HIGH (ref 1.7–2.4)

## 2024-01-14 LAB — RPR: RPR Ser Ql: NONREACTIVE

## 2024-01-14 MED ORDER — MAGNESIUM SULFATE 40 GM/1000ML IV SOLN
2.0000 g/h | INTRAVENOUS | Status: AC
  Filled 2024-01-14: qty 1000

## 2024-01-14 MED ORDER — NIFEDIPINE ER OSMOTIC RELEASE 30 MG PO TB24
30.0000 mg | ORAL_TABLET | Freq: Every day | ORAL | Status: DC
  Administered 2024-01-14 – 2024-01-16 (×3): 30 mg via ORAL
  Filled 2024-01-14 (×3): qty 1

## 2024-01-14 MED ORDER — OXYCODONE HCL 5 MG PO TABS: 5.0000 mg | ORAL_TABLET | Freq: Four times a day (QID) | ORAL | Status: DC | PRN

## 2024-01-14 MED ORDER — IBUPROFEN 600 MG PO TABS
600.0000 mg | ORAL_TABLET | Freq: Four times a day (QID) | ORAL | Status: DC | PRN
  Administered 2024-01-14 – 2024-01-15 (×5): 600 mg via ORAL
  Filled 2024-01-14 (×5): qty 1

## 2024-01-14 MED ORDER — TRANEXAMIC ACID-NACL 1000-0.7 MG/100ML-% IV SOLN
INTRAVENOUS | Status: AC
  Administered 2024-01-14: 1000 mg via INTRAVENOUS
  Filled 2024-01-14: qty 100

## 2024-01-14 MED ORDER — FUROSEMIDE 40 MG PO TABS
40.0000 mg | ORAL_TABLET | Freq: Two times a day (BID) | ORAL | Status: AC
  Administered 2024-01-14 – 2024-01-16 (×4): 40 mg via ORAL
  Filled 2024-01-14 (×4): qty 1

## 2024-01-14 MED ORDER — TRANEXAMIC ACID-NACL 1000-0.7 MG/100ML-% IV SOLN: 1000.0000 mg | INTRAVENOUS | Status: AC

## 2024-01-14 MED ORDER — FUROSEMIDE 20 MG PO TABS
20.0000 mg | ORAL_TABLET | Freq: Once | ORAL | Status: AC
  Administered 2024-01-14: 20 mg via ORAL
  Filled 2024-01-14: qty 1

## 2024-01-14 MED ORDER — LACTATED RINGERS IV SOLN: INTRAVENOUS | Status: AC

## 2024-01-14 NOTE — Progress Notes (Deleted)
 Allison Holmes is a 38 y.o. [id number] s/p SVD at [redacted]w[redacted]d. She was admitted for IOL 2/2 preE w/ severe features.   ROM:  Clear GBS Status: positive (01/13/24). Given PCN intrapartum. Maximum Maternal Temp: 98.17F  LABOR COURSE Initial SVE: 0/Thick/High.  She then progressed to complete.  Delivery Note Delivery Date/Time: 01/14/24 0138 Called to room and patient was complete and pushing. Head delivered ROA. No nuchal cord present. Shoulder and body delivered in usual fashion. Infant with spontaneous cry, placed on mother's abdomen, dried and stimulated. Cord clamped x 2 after 1-minute delay, and cut by support person. Cord blood drawn. Placenta delivered spontaneously with gentle cord traction. Appears intact with 3VC. Fundus firm with massage and Pitocin . Labia, perineum, vagina, and cervix inspected with no lacerations noted.   APGAR: 1 MIN: 8, 5 MIN: 9 Weight pending.   Cord: 3VC with the following complications: None.    Anesthesia:  Epidural Episiotomy: None Lacerations: None Est. Blood Loss (mL): 213  Mom to postpartum.  Baby to Couplet care / Skin to Skin.  Creighton Creighton LABOR, Medical Student 01/14/24 2:08 AM     Attestation of Supervision of Student:  I confirm that I have verified the information documented in the medical student's note and that I have also personally the delivery and was gloved and present for entirety of delivery.  I have verified that all services and findings are accurately documented in this student's note; and I agree with management and plan as outlined in the documentation. I have also made any necessary editorial changes.   Alain Sor, MD Center for [facility], [facility] 01/14/2024 9:29 AM

## 2024-01-14 NOTE — Anesthesia Postprocedure Evaluation (Signed)
 Anesthesia Post Note  Patient: Allison Holmes  Procedure(s) Performed: AN AD HOC LABOR EPIDURAL     Patient location during evaluation: Mother Baby Anesthesia Type: Epidural Level of consciousness: awake Pain management: satisfactory to patient Vital Signs Assessment: post-procedure vital signs reviewed and stable Respiratory status: spontaneous breathing Cardiovascular status: stable Anesthetic complications: no  No notable events documented.  Last Vitals:  Vitals:   01/14/24 1230 01/14/24 1523  BP:  110/63  Pulse:  78  Resp: 18 18  Temp:  36.8 C  SpO2:  98%    Last Pain:  Vitals:   01/14/24 1523  TempSrc: Oral  PainSc:    Pain Goal:                   [employer]

## 2024-01-14 NOTE — Lactation Note (Signed)
 This note was copied from a baby's chart. Lactation Consultation Note  Patient Name: Allison Holmes Date: 01/14/2024 Age:39 hours Reason for consult: Initial assessment;Late-preterm 34-36.6wks;Other (Comment) (AMA, hypertension, Pre-E)  Used [employer] Interpreter services for Arabic; interpreter # [phone] Allison Holmes.  Visited with family of 10 hours old LPI female; Allison Holmes is a P4 and experienced breastfeeding. She has already taken baby to breast and reports feedings are comfortable, her plan is also supplementing with formula until her milk comes in. This LC and [facility] student Allison Holmes fit her with a pumping band in size L for hands on pumping. Provided extensive education about why pumping was necessary in addition to feedings at the breast if her goal was to supplement with her EBM. She initiated pumping during [facility] consult, praised her for her efforts. Reviewed normal LPI behavior, feeding cues, pumping schedule, supplementation and anticipatory guidelines.  Maternal Data Has patient been taught Hand Expression?: Yes Does the patient have breastfeeding experience prior to this delivery?: Yes How long did the patient breastfeed?: 1st 8-9 months, 2nd one for 2 years,  and 3rd one for 5 months  Lactation Tools Discussed/Used Tools: Pump;Flanges;Coconut oil;Hands-free pumping top Flange Size: 21 Breast pump type: Double-Electric Breast Pump Pump Education: Setup, frequency, and cleaning;Milk Storage Reason for Pumping: LPI Pumping frequency: q 3 hours docummented Pumped volume:  (drops)  Interventions Interventions: Breast feeding basics reviewed;Coconut oil;DEBP;Education;LC Services brochure;LPT handout/interventions  Plan Encouraged to put baby to breast +8 times/24 hours or sooner if feeding cues are present She'll pump every 3 hours or whenever baby is getting a bottle Family will continue supplementing with Similac 20 calorie formula every 3 hours per LPI policy, will follow  supplementation guidelines per baby's age in hours  No other support person at this time. All questions and concerns answered, family to contact [facility] services PRN.  Discharge WIC Program: Yes [facility] referral sent to [facility] Status Consult Status: Follow-up Date: 01/15/24 Follow-up type: In-patient   Allison Holmes 01/14/2024, 12:05 PM

## 2024-01-14 NOTE — Plan of Care (Signed)
  Problem: Education: Goal: Knowledge of disease or condition will improve Outcome: Progressing Goal: Knowledge of the prescribed therapeutic regimen will improve Outcome: Progressing   Problem: Fluid Volume: Goal: Peripheral tissue perfusion will improve Outcome: Progressing   Problem: Clinical Measurements: Goal: Complications related to disease process, condition or treatment will be avoided or minimized Outcome: Progressing   Problem: Education: Goal: Knowledge of General Education information will improve Description: Including pain rating scale, medication(s)/side effects and non-pharmacologic comfort measures Outcome: Progressing   Problem: Health Behavior/Discharge Planning: Goal: Ability to manage health-related needs will improve Outcome: Progressing   Problem: Clinical Measurements: Goal: Ability to maintain clinical measurements within normal limits will improve Outcome: Progressing Goal: Will remain free from infection Outcome: Progressing Goal: Diagnostic test results will improve Outcome: Progressing Goal: Respiratory complications will improve Outcome: Progressing Goal: Cardiovascular complication will be avoided Outcome: Progressing   Problem: Activity: Goal: Risk for activity intolerance will decrease Outcome: Progressing   Problem: Nutrition: Goal: Adequate nutrition will be maintained Outcome: Progressing   Problem: Coping: Goal: Level of anxiety will decrease Outcome: Progressing   Problem: Elimination: Goal: Will not experience complications related to bowel motility Outcome: Progressing Goal: Will not experience complications related to urinary retention Outcome: Progressing   Problem: Pain Management: Goal: General experience of comfort will improve Outcome: Progressing   Problem: Safety: Goal: Ability to remain free from injury will improve Outcome: Progressing   Problem: Skin Integrity: Goal: Risk for impaired skin integrity will  decrease Outcome: Progressing   Problem: Education: Goal: Knowledge of Childbirth will improve Outcome: Progressing Goal: Ability to make informed decisions regarding treatment and plan of care will improve Outcome: Progressing Goal: Ability to state and carry out methods to decrease the pain will improve Outcome: Progressing Goal: Individualized Educational Video(s) Outcome: Progressing   Problem: Coping: Goal: Ability to verbalize concerns and feelings about labor and delivery will improve Outcome: Progressing   Problem: Life Cycle: Goal: Ability to make normal progression through stages of labor will improve Outcome: Progressing Goal: Ability to effectively push during vaginal delivery will improve Outcome: Progressing   Problem: Role Relationship: Goal: Will demonstrate positive interactions with the child Outcome: Progressing   Problem: Safety: Goal: Risk of complications during labor and delivery will decrease Outcome: Progressing   Problem: Pain Management: Goal: Relief or control of pain from uterine contractions will improve Outcome: Progressing

## 2024-01-14 NOTE — Discharge Summary (Signed)
Postpartum Discharge Summary     Patient Name: Allison Holmes DOB: [birth year] MRN: [id number]  Date of admission: 01/13/2024 Delivery date:01/14/2024 Delivering provider: Sundra Aland Date of discharge: 01/16/2024  Admitting diagnosis: Pregnancy at 36/3. Severe pre-eclampisa (BPs, pulmonary edema, elevated BNP Intrauterine pregnancy: [redacted]w[redacted]d     Secondary diagnosis:  Principal Problem:   SVD (spontaneous vaginal delivery) Active Problems:   Language barrier affecting health care   Supervision of high risk pregnancy, antepartum   Advanced maternal age in multigravida   Large for gestational age fetus affecting management of mother, antepartum   Severe pre-eclampsia    Discharge diagnosis: Preterm Pregnancy Delivered, Preeclampsia (severe), and AMA                                               Post partum procedures: Mg Augmentation: AROM and Cytotec  Hospital course: Patient presented to [facility] with pain and diagnosed with severe pre-eclampsia with the above s/s. She had an uncomplicated IOL and delivery.  Patient had an uncomplicated postpartum course. She had a repeat pCXR and this was stable vs admission to being improved; she also had a negative pp echo. She also had covid/flu/rsv testing due to chronic nasal congestion and this was negative. She was deemed stable for discharge on PPD#2   Newborn Data: Birth date:01/14/2024 Birth time:1:38 AM Gender:Female Living status:Living Apgars:8 ,9  Weight:2990 g  Magnesium Sulfate received: Yes: Seizure prophylaxis  Immunizations received: Immunization History  Administered Date(s) Administered   Influenza,inj,Quad PF,6+ Mos 03/21/2018, 09/05/2018   Tdap 06/25/2018, 04/16/2022, 12/04/2023    Physical exam  Vitals:   01/15/24 1954 01/15/24 2343 01/16/24 0540 01/16/24 0848  BP: 115/70 103/72 103/62 104/66  Pulse: 88 84 70 92  Resp: 17 16 16 17   Temp: 98.2 F (36.8 C) 98 F (36.7 C) 98.2 F (36.8 C) 98.8 F (37.1 C)   TempSrc: Oral Oral Oral Oral  SpO2: 98% 97% 99% 98%  Weight:      Height:       General: Well nourished, well developed female in no acute distress. Abdomen: nttp Cardiovascular: S1, S2 normal, no murmur, rub or gallop, regular rate and rhythm Respiratory: CTAB, no respiratory distress Extremities: no clubbing, cyanosis or edema Skin: Warm and dry.   Labs: Lab Results  Component Value Date   WBC 9.4 01/15/2024   HGB 10.0 (L) 01/15/2024   HCT 31.0 (L) 01/15/2024   MCV 80.9 01/15/2024   PLT 263 01/15/2024      Latest Ref Rng & Units 01/15/2024    4:33 AM  CMP  Glucose 70 - 99 mg/dL 89   BUN 6 - 20 mg/dL 5   Creatinine 4.09 - 8.11 mg/dL 9.14   Sodium 782 - 956 mmol/L 138   Potassium 3.5 - 5.1 mmol/L 3.4   Chloride 98 - 111 mmol/L 105   CO2 22 - 32 mmol/L 25   Calcium 8.9 - 10.3 mg/dL 7.2   Total Protein 6.5 - 8.1 g/dL 5.7   Total Bilirubin 0.0 - 1.2 mg/dL 0.4   Alkaline Phos 38 - 126 U/L 195   AST 15 - 41 U/L 19   ALT 0 - 44 U/L 10    Edinburgh Score:    01/15/2024    6:20 AM  Edinburgh Postnatal Depression Scale Screening Tool  I have been able to laugh and  see the funny side of things. 0  I have looked forward with enjoyment to things. 0  I have blamed myself unnecessarily when things went wrong. 0  I have been anxious or worried for no good reason. 0  I have felt scared or panicky for no good reason. 0  Things have been getting on top of me. 0  I have been so unhappy that I have had difficulty sleeping. 1  I have felt sad or miserable. 0  I have been so unhappy that I have been crying. 0  The thought of harming myself has occurred to me. 0  Edinburgh Postnatal Depression Scale Total 1   Edinburgh Postnatal Depression Scale Total: 1   After visit meds:  Allergies as of 01/16/2024       Reactions   Pork-derived Products         Medication List     STOP taking these medications    Doxylamine-Pyridoxine 10-10 MG Tbec Commonly known as: Diclegis    fluticasone 50 MCG/ACT nasal spray Commonly known as: FLONASE       TAKE these medications    Ferric Maltol 30 MG Caps Take 1 capsule (30 mg total) by mouth every other day for 30 doses.   ibuprofen 600 MG tablet Commonly known as: ADVIL Take 1 tablet (600 mg total) by mouth every 6 (six) hours as needed for fever, headache, mild pain (pain score 1-3) or cramping.   NIFEdipine 30 MG 24 hr tablet Commonly known as: ADALAT CC Take 1 tablet (30 mg total) by mouth daily. Start taking on: January 17, 2024   pantoprazole 40 MG tablet Commonly known as: Protonix Take 1 tablet (40 mg total) by mouth daily.   Vitafol Ultra 29-0.6-0.4-200 MG Caps Take 1 capsule by mouth daily.   vitamin C 250 MG tablet Commonly known as: ASCORBIC ACID Take 250 mg by mouth daily.         Discharge home in stable condition Infant Feeding: Breast Infant Disposition:home with mother Discharge instruction: per After Visit Summary and Postpartum booklet. Activity: Advance as tolerated. Pelvic rest for 6 weeks.  Diet: routine diet Future Appointments: Future Appointments  Date Time Provider Department Center  01/16/2024  3:00 PM [facility] ECHO 3 [facility]  01/21/2024  9:00 AM Tobb, Kardie, DO [facility] None   Follow up Visit: Message sent to [facility] 1/14  Please schedule this patient for a In person postpartum visit in 4 weeks with the following provider: Any provider. Additional Postpartum F/U:BP check 1 week  High risk pregnancy complicated by:  pre-e Delivery mode:  Vaginal, Spontaneous Anticipated Birth Control:  Nexplanon (outpateint)   01/16/2024 [place] Bing, MD

## 2024-01-15 ENCOUNTER — Encounter: Payer: Medicaid Other | Admitting: Certified Nurse Midwife

## 2024-01-15 LAB — COMPREHENSIVE METABOLIC PANEL
ALT: 10 U/L (ref 0–44)
AST: 19 U/L (ref 15–41)
Albumin: 2 g/dL — ABNORMAL LOW (ref 3.5–5.0)
Alkaline Phosphatase: 195 U/L — ABNORMAL HIGH (ref 38–126)
Anion gap: 8 (ref 5–15)
BUN: 5 mg/dL — ABNORMAL LOW (ref 6–20)
CO2: 25 mmol/L (ref 22–32)
Calcium: 7.2 mg/dL — ABNORMAL LOW (ref 8.9–10.3)
Chloride: 105 mmol/L (ref 98–111)
Creatinine, Ser: 0.67 mg/dL (ref 0.44–1.00)
GFR, Estimated: >60 mL/min (ref >60)
Glucose, Bld: 89 mg/dL (ref 70–99)
Potassium: 3.4 mmol/L — ABNORMAL LOW (ref 3.5–5.1)
Sodium: 138 mmol/L (ref 135–145)
Total Bilirubin: 0.4 mg/dL (ref 0.0–1.2)
Total Protein: 5.7 g/dL — ABNORMAL LOW (ref 6.5–8.1)

## 2024-01-15 LAB — CULTURE, BETA STREP (GROUP B ONLY)

## 2024-01-15 LAB — CBC
HCT: 31 % — ABNORMAL LOW (ref 36.0–46.0)
Hemoglobin: 10 g/dL — ABNORMAL LOW (ref 12.0–15.0)
MCH: 26.1 pg (ref 26.0–34.0)
MCHC: 32.3 g/dL (ref 30.0–36.0)
MCV: 80.9 fL (ref 80.0–100.0)
Platelets: 263 10*3/uL (ref 150–400)
RBC: 3.83 MIL/uL — ABNORMAL LOW (ref 3.87–5.11)
RDW: 16.3 % — ABNORMAL HIGH (ref 11.5–15.5)
WBC: 9.4 10*3/uL (ref 4.0–10.5)
nRBC: 0 % (ref 0.0–0.2)

## 2024-01-15 LAB — MAGNESIUM
Magnesium: 3.7 mg/dL — ABNORMAL HIGH (ref 1.7–2.4)
Magnesium: 2.6 mg/dL — ABNORMAL HIGH (ref 1.7–2.4)
Magnesium: 2.2 mg/dL (ref 1.7–2.4)

## 2024-01-15 MED ORDER — DOCUSATE SODIUM 100 MG PO CAPS
100.0000 mg | ORAL_CAPSULE | Freq: Two times a day (BID) | ORAL | Status: DC
Start: 2024-01-15 — End: 2024-01-17
  Administered 2024-01-15 – 2024-01-16 (×2): 100 mg via ORAL
  Filled 2024-01-15 (×2): qty 1

## 2024-01-15 MED ORDER — MENTHOL 3 MG MT LOZG
1.0000 | LOZENGE | OROMUCOSAL | Status: DC | PRN
  Administered 2024-01-15: 3 mg via ORAL
  Filled 2024-01-15: qty 9

## 2024-01-15 NOTE — Progress Notes (Signed)
 Post Partum Day 1 Subjective: No complaints, up ad lib, voiding, and tolerating PO.  Baby is stable at bedside. Breastfeeding, Moderate lochia reported.  Patient denies any headaches, visual symptoms, RUQ/epigastric pain or other concerning symptoms.   Objective: Blood pressure 118/77, pulse 70, temperature 98.1 F (36.7 C), temperature source Oral, resp. rate 19, height 5\' 7"  (1.702 m), weight 85.7 kg, last menstrual period 05/12/2023, SpO2 100%, unknown if currently breastfeeding.  Physical Exam:  General: alert and no distress Lochia: appropriate Uterine Fundus: firm, NT DVT Evaluation: No evidence of DVT seen on physical exam. Negative Homan's sign.  No cords or calf tenderness. No significant calf/ankle edema.  Recent Labs    01/14/24 2144 01/15/24 0433  HGB 9.8* 10.0*  HCT 30.1* 31.0*   Assessment/Plan: BP stable on Procardia  XL 30 mg and Lasix  daily, no symptoms Encourage OOB Breastfeeding and Lactation consult Unsure about circumcision for infant while inpatient, she will think about it Wants Nexplanon  outpatient. Continue routine postpartum care, plan to discharge tomorrow if remains stable.    LOS: 2 days   Lenoard Rad, MD 01/15/2024, 9:55 AM

## 2024-01-15 NOTE — Lactation Note (Addendum)
 This note was copied from a baby's chart. Lactation Consultation Note  Patient Name: Allison Holmes [id number] Date: 01/15/2024 Age:39 hours Reason for consult: Follow-up assessment;Late-preterm 34-36.6wks;Infant weight loss (6 % weight loss) Baby wide awake. LC offered to check the diaper and changed a wet diaper. Baby rooting and mentioned to mom the baby was rooting. Mom willing to latch the baby at the breast. LC assisted to latch in the the football and mom willing to try a different position. Baby latched with swallows and still feeding at 7 mins and per mom comfortable.  WIC referral had already been sent and per mom has not received a call from [facility] yet.   Maternal Data Has patient been taught Hand Expression?: Yes  Feeding Mother's Current Feeding Choice: Breast Milk and Formula Nipple Type: Slow - flow  LATCH Score Latch: Grasps breast easily, tongue down, lips flanged, rhythmical sucking.  Audible Swallowing: Spontaneous and intermittent  Type of Nipple: Everted at rest and after stimulation  Comfort (Breast/Nipple): Soft / non-tender  Hold (Positioning): Assistance needed to correctly position infant at breast and maintain latch.  LATCH Score: 9   Lactation Tools Discussed/Used Tools: Pump;Flanges;Coconut oil Flange Size: 21 Breast pump type: Double-Electric Breast Pump Pump Education: Milk Storage Pumped volume:  (per mom x 1 alittle bit)  Interventions Interventions: Breast feeding basics reviewed;Assisted with latch;Skin to skin;Breast massage;Pre-pump if needed;Breast compression;Adjust position;Support pillows;Position options;DEBP;Hand pump;Education;CDC Guidelines for Breast Pump Cleaning;LC Services brochure  Discharge Pump: Manual WIC Program: Yes  Consult Status Consult Status: Follow-up Date: 01/16/24 Follow-up type: In-patient    Allison Holmes 01/15/2024, 1:19 PM

## 2024-01-16 ENCOUNTER — Telehealth (HOSPITAL_COMMUNITY): Payer: Self-pay | Admitting: Cardiology

## 2024-01-16 ENCOUNTER — Inpatient Hospital Stay (HOSPITAL_COMMUNITY): Payer: Medicaid Other

## 2024-01-16 ENCOUNTER — Ambulatory Visit (HOSPITAL_COMMUNITY): Payer: Medicaid Other | Attending: Cardiology

## 2024-01-16 ENCOUNTER — Other Ambulatory Visit (HOSPITAL_COMMUNITY): Payer: Self-pay

## 2024-01-16 ENCOUNTER — Other Ambulatory Visit: Payer: Self-pay | Admitting: Obstetrics & Gynecology

## 2024-01-16 DIAGNOSIS — J810 Acute pulmonary edema: Secondary | ICD-10-CM | POA: Diagnosis not present

## 2024-01-16 DIAGNOSIS — I10 Essential (primary) hypertension: Secondary | ICD-10-CM | POA: Diagnosis not present

## 2024-01-16 DIAGNOSIS — R0602 Shortness of breath: Secondary | ICD-10-CM | POA: Diagnosis not present

## 2024-01-16 LAB — ECHOCARDIOGRAM COMPLETE
Area-P 1/2: 5.84 cm2
Calc EF: 61.4 %
Height: 67 in
S' Lateral: 2.7 cm
Single Plane A2C EF: 59.2 %
Single Plane A4C EF: 62.8 %
Weight: 3024 [oz_av]

## 2024-01-16 LAB — RESP PANEL BY RT-PCR (RSV, FLU A&B, COVID)  RVPGX2
Influenza A by PCR: NEGATIVE
Influenza B by PCR: NEGATIVE
Resp Syncytial Virus by PCR: NEGATIVE
SARS Coronavirus 2 by RT PCR: NEGATIVE

## 2024-01-16 MED ORDER — IBUPROFEN 600 MG PO TABS
600.0000 mg | ORAL_TABLET | Freq: Four times a day (QID) | ORAL | 0 refills | Status: DC | PRN
  Filled 2024-01-16: qty 30, 8d supply, fill #0

## 2024-01-16 MED ORDER — FERRIC MALTOL 30 MG PO CAPS: 1.0000 | ORAL_CAPSULE | ORAL | Status: AC

## 2024-01-16 MED ORDER — SALINE SPRAY 0.65 % NA SOLN: 1.0000 | NASAL | Status: DC | PRN

## 2024-01-16 MED ORDER — NIFEDIPINE ER OSMOTIC RELEASE 30 MG PO TB24: 30.0000 mg | ORAL_TABLET | Freq: Every day | ORAL | 1 refills | Status: DC

## 2024-01-16 MED ORDER — IBUPROFEN 600 MG PO TABS: 600.0000 mg | ORAL_TABLET | Freq: Four times a day (QID) | ORAL | 1 refills | Status: DC | PRN

## 2024-01-16 MED ORDER — NIFEDIPINE ER 30 MG PO TB24
30.0000 mg | ORAL_TABLET | Freq: Every day | ORAL | 0 refills | Status: DC
  Filled 2024-01-16: qty 42, 42d supply, fill #0

## 2024-01-16 NOTE — Progress Notes (Signed)
  Echocardiogram 2D Echocardiogram has been performed.  Allison Holmes 01/16/2024, 3:33 PM

## 2024-01-16 NOTE — Progress Notes (Addendum)
Daily Postpartum Note  Admission Date: 01/13/2024 Current Date: 01/16/2024 10:38 AM  Allison Holmes is a 38 y.o. [id number] PPD#2 SVD/intact at [redacted]w[redacted]d after admitted for IOL for severe pre-eclampsia (BPs, pulmonary edema, elevated BNP)  Pregnancy complicated by: Patient Active Problem List   Diagnosis Date Noted   Severe pre-eclampsia 01/13/2024   Large for gestational age fetus affecting management of mother, antepartum 12/27/2023   Supervision of high risk pregnancy, antepartum 09/18/2023   Advanced maternal age in multigravida 09/18/2023   Language barrier affecting health care 03/22/2018   SVD (spontaneous vaginal delivery) 09/02/2016    Overnight/24hr events:  none  Subjective:  Patient feels much better but concerned because she's had several weeks of nasal congestion and with her CXR findings on admission, she's concerned about being infectious to the baby. Currently, aside from the nasal congestion, pt is asymptomatic.   Objective:    Current Vital Signs 24h Vital Sign Ranges  T 98.8 F (37.1 C) Temp  Avg: 98.5 F (36.9 C)  Min: 98 F (36.7 C)  Max: 99.5 F (37.5 C)  BP 104/66 BP  Min: 103/62  Max: 117/74  HR 92 Pulse  Avg: 84  Min: 70  Max: 92  RR 17 Resp  Avg: 16.8  Min: 16  Max: 18  SaO2 98 % Room Air SpO2  Avg: 98.3 %  Min: 97 %  Max: 99 %       24 Hour I/O Current Shift I/O  Time Ins Outs 01/15 0701 - 01/16 0700 In: 600 [P.O.:600] Out: 2050 [Urine:2050] No intake/output data recorded.   Patient Vitals for the past 24 hrs:  BP Temp Temp src Pulse Resp SpO2  01/16/24 0848 104/66 98.8 F (37.1 C) Oral 92 17 98 %  01/16/24 0540 103/62 98.2 F (36.8 C) Oral 70 16 99 %  01/15/24 2343 103/72 98 F (36.7 C) Oral 84 16 97 %  01/15/24 1954 115/70 98.2 F (36.8 C) Oral 88 17 98 %  01/15/24 1654 108/64 99.5 F (37.5 C) Oral 91 17 99 %  01/15/24 1203 117/74 98.4 F (36.9 C) -- 79 18 99 %   Physical exam: General: Well nourished, well developed female in no acute  distress. Abdomen: nttp Cardiovascular: S1, S2 normal, no murmur, rub or gallop, regular rate and rhythm Respiratory: CTAB, no respiratory distress Extremities: no clubbing, cyanosis or edema Skin: Warm and dry.   Medications: Current Facility-Administered Medications  Medication Dose Route Frequency Provider Last Rate Last Admin   docusate sodium (COLACE) capsule 100 mg  100 mg Oral BID Lazaro Arms, MD   100 mg at 01/15/24 2218   labetalol (NORMODYNE) injection 20 mg  20 mg Intravenous PRN Sundra Aland, MD   20 mg at 01/13/24 1603   And   labetalol (NORMODYNE) injection 40 mg  40 mg Intravenous PRN Sundra Aland, MD       And   labetalol (NORMODYNE) injection 80 mg  80 mg Intravenous PRN Sundra Aland, MD       And   hydrALAZINE (APRESOLINE) injection 10 mg  10 mg Intravenous PRN Sundra Aland, MD       ibuprofen (ADVIL) tablet 600 mg  600 mg Oral Q6H PRN Adam Phenix, MD   600 mg at 01/15/24 2151   menthol-cetylpyridinium (CEPACOL) lozenge 3 mg  1 lozenge Oral PRN Lazaro Arms, MD   3 mg at 01/15/24 2218   NIFEdipine (PROCARDIA-XL/NIFEDICAL-XL) 24 hr tablet 30 mg  30 mg Oral Daily  Sundra Aland, MD   30 mg at 01/15/24 1017   oxyCODONE (Oxy IR/ROXICODONE) immediate release tablet 5 mg  5 mg Oral Q6H PRN Adam Phenix, MD       Labs:  Recent Labs  Lab 01/14/24 1557 01/14/24 2144 01/15/24 0433  WBC 10.3 9.4 9.4  HGB 10.5* 9.8* 10.0*  HCT 32.0* 30.1* 31.0*  PLT 242 257 263    Recent Labs  Lab 01/14/24 1557 01/14/24 2144 01/15/24 0433  NA 136 137 138  K 3.5 3.5 3.4*  CL 103 103 105  CO2 22 24 25   BUN 5* <5* 5*  CREATININE 0.64 0.72 0.67  CALCIUM 7.1* 6.9* 7.2*  PROT 6.2* 5.9* 5.7*  BILITOT 0.4 0.3 0.4  ALKPHOS 192* 178* 195*  ALT 11 12 10   AST 21 18 19   GLUCOSE 89 109* 89   Radiology:  No new imaging  Assessment & Plan:  Patient improving *PP: routine care. B POS/boy, no circ/outpatient nexplanon *Severe pre-eclampsia: s/p lasix course and  great diuresis. Will get a maternal echo given her presentation and lab findings. I also told her that most likely her pulmonary edema was from fluid overload from the pre-eclampsia but will get a follow up cxr and resp swab to ensure low risk for infectious etiology *Dispo: likely later today; request sent for 1wk bp check   Phone interpreter used.   Cornelia Copa MD Attending Center for [facility] GYN Consult Phone: [phone] (M-F, [phone]) & [phone]  (Off hours, weekends, holidays)

## 2024-01-16 NOTE — Telephone Encounter (Signed)
Patient was scheduled for echocardiogram this am, however patient is in the hospital and looks like they have ordered it to be done there today.. Patient is not a NO SHOW. Thank you.

## 2024-01-16 NOTE — Plan of Care (Signed)
  Problem: Education: Goal: Knowledge of disease or condition will improve Outcome: Adequate for Discharge Goal: Knowledge of the prescribed therapeutic regimen will improve Outcome: Adequate for Discharge   Problem: Fluid Volume: Goal: Peripheral tissue perfusion will improve Outcome: Adequate for Discharge   Problem: Clinical Measurements: Goal: Complications related to disease process, condition or treatment will be avoided or minimized Outcome: Adequate for Discharge   Problem: Education: Goal: Knowledge of General Education information will improve Description: Including pain rating scale, medication(s)/side effects and non-pharmacologic comfort measures Outcome: Adequate for Discharge   Problem: Health Behavior/Discharge Planning: Goal: Ability to manage health-related needs will improve Outcome: Adequate for Discharge   Problem: Clinical Measurements: Goal: Ability to maintain clinical measurements within normal limits will improve Outcome: Adequate for Discharge Goal: Will remain free from infection Outcome: Adequate for Discharge Goal: Diagnostic test results will improve Outcome: Adequate for Discharge Goal: Respiratory complications will improve Outcome: Adequate for Discharge Goal: Cardiovascular complication will be avoided Outcome: Adequate for Discharge   Problem: Activity: Goal: Risk for activity intolerance will decrease Outcome: Adequate for Discharge   Problem: Nutrition: Goal: Adequate nutrition will be maintained Outcome: Adequate for Discharge   Problem: Coping: Goal: Level of anxiety will decrease Outcome: Adequate for Discharge   Problem: Elimination: Goal: Will not experience complications related to bowel motility Outcome: Adequate for Discharge Goal: Will not experience complications related to urinary retention Outcome: Adequate for Discharge   Problem: Pain Management: Goal: General experience of comfort will improve Outcome:  Adequate for Discharge   Problem: Safety: Goal: Ability to remain free from injury will improve Outcome: Adequate for Discharge   Problem: Skin Integrity: Goal: Risk for impaired skin integrity will decrease Outcome: Adequate for Discharge   Problem: Education: Goal: Knowledge of Childbirth will improve Outcome: Adequate for Discharge Goal: Ability to make informed decisions regarding treatment and plan of care will improve Outcome: Adequate for Discharge Goal: Ability to state and carry out methods to decrease the pain will improve Outcome: Adequate for Discharge Goal: Individualized Educational Video(s) Outcome: Adequate for Discharge   Problem: Coping: Goal: Ability to verbalize concerns and feelings about labor and delivery will improve Outcome: Adequate for Discharge   Problem: Life Cycle: Goal: Ability to make normal progression through stages of labor will improve Outcome: Adequate for Discharge Goal: Ability to effectively push during vaginal delivery will improve Outcome: Adequate for Discharge   Problem: Role Relationship: Goal: Will demonstrate positive interactions with the child Outcome: Adequate for Discharge   Problem: Safety: Goal: Risk of complications during labor and delivery will decrease Outcome: Adequate for Discharge   Problem: Pain Management: Goal: Relief or control of pain from uterine contractions will improve Outcome: Adequate for Discharge

## 2024-01-17 ENCOUNTER — Other Ambulatory Visit (HOSPITAL_COMMUNITY): Payer: Self-pay

## 2024-01-21 ENCOUNTER — Ambulatory Visit: Payer: Medicaid Other | Attending: Cardiology | Admitting: Cardiology

## 2024-01-24 ENCOUNTER — Other Ambulatory Visit: Payer: Self-pay

## 2024-01-24 ENCOUNTER — Ambulatory Visit: Payer: Medicaid Other

## 2024-01-24 VITALS — BP 110/69 | HR 71 | Ht 67.0 in | Wt 173.0 lb

## 2024-01-24 DIAGNOSIS — Z0130 Encounter for examination of blood pressure without abnormal findings: Secondary | ICD-10-CM

## 2024-01-24 NOTE — Progress Notes (Signed)
Blood Pressure Check Visit  Allison Holmes is here for blood pressure check following spontaneous vaginal birth on 01/16/24. Patient had severe pre-eclampsia during pregnancy. BP today is 110/69. Patient denies any visual disturbances, headache or edema today.   Patient has a PP appointment on 02/26/24 at 1115. Patient confirmed scheduled appointment.   Quintella Reichert, RN 01/24/2024  9:16 AM

## 2024-01-29 ENCOUNTER — Ambulatory Visit: Payer: Medicaid Other

## 2024-02-01 DIAGNOSIS — Z419 Encounter for procedure for purposes other than remedying health state, unspecified: Secondary | ICD-10-CM | POA: Diagnosis not present

## 2024-02-26 ENCOUNTER — Encounter: Payer: Self-pay | Admitting: Obstetrics and Gynecology

## 2024-02-26 ENCOUNTER — Other Ambulatory Visit: Payer: Self-pay

## 2024-02-26 ENCOUNTER — Ambulatory Visit: Payer: Medicaid Other | Admitting: Obstetrics and Gynecology

## 2024-02-26 DIAGNOSIS — Z3202 Encounter for pregnancy test, result negative: Secondary | ICD-10-CM

## 2024-02-26 DIAGNOSIS — Z603 Acculturation difficulty: Secondary | ICD-10-CM

## 2024-02-26 DIAGNOSIS — Z758 Other problems related to medical facilities and other health care: Secondary | ICD-10-CM

## 2024-02-26 DIAGNOSIS — O1413 Severe pre-eclampsia, third trimester: Secondary | ICD-10-CM

## 2024-02-26 DIAGNOSIS — Z3040 Encounter for surveillance of contraceptives, unspecified: Secondary | ICD-10-CM

## 2024-02-26 HISTORY — PX: INSERTION OF IMPLANON ROD: OBO 1005

## 2024-02-26 LAB — POCT PREGNANCY, URINE: Preg Test, Ur: NEGATIVE

## 2024-02-26 MED ORDER — ETONOGESTREL 68 MG ~~LOC~~ IMPL
68.0000 mg | DRUG_IMPLANT | Freq: Once | SUBCUTANEOUS | Status: AC
Start: 2024-02-26 — End: 2024-02-26
  Administered 2024-02-26: 68 mg via SUBCUTANEOUS

## 2024-02-26 NOTE — Procedures (Signed)
 Nexplanon Insertion Procedure Note Prior to the procedure being performed, the patient was asked to state their full name, date of birth, type of procedure being performed and the exact location of the operative site. This information was then checked against the documentation in the patient's chart. Prior to the procedure being performed, a "time out" was performed by the physician that confirmed the correct patient, procedure and site.  After informed consent was obtained, the patient's non-dominant right arm was chosen for insertion. A site was marked approximately 8 cm proximal to the medial epicondyle in the sulcus between the biceps and triceps on the inner surface. The area was cleaned with alcohol then local anesthesia was infiltrated with 3 ml of 1% lidocaine along the planned insertion track. The area was prepped with betadine. Using sterile technique the Nexplanon device was inserted per manufacturer's guidelines in the subdermal connective tissue using the standard insertion technique without difficulty. Pressure was applied and the insertion site was hemostatic. The presence of the Nexplanon was confirmed immediately after insertion by palpation by both me and the patient and by checking the tip of needle for the absence of the insert.  A pressure dressing was applied.  The patient tolerated the procedure well.  Ipad interpreter used  [place] Bing, Montez Hageman MD Attending Center for [facility])

## 2024-02-26 NOTE — Progress Notes (Signed)
    Post Partum Visit Note  Allison Holmes is a 38 y.o. [id number] SVD/intact perineum 36/3 weeks. Patient induced for severe pre-eclampsia. Hx significant for AMA.  Anesthesia: epidural. Postpartum course has been uncomplicated. Baby is doing well. Baby is feeding by both breast and bottle - [employer] Good Start. Bleeding no bleeding or period y et. Bowel function is normal. Bladder function is normal. Patient is not sexually active. Contraception method is none; she is interested in nexplanon. Postpartum depression screening: negative.   Edinburgh Postnatal Depression Scale - 02/26/24 1154       Edinburgh Postnatal Depression Scale:  In the Past 7 Days   I have been able to laugh and see the funny side of things. 0    I have blamed myself unnecessarily when things went wrong. 0    I have been anxious or worried for no good reason. 0    I have felt scared or panicky for no good reason. 0    Things have been getting on top of me. 0    I have been so unhappy that I have had difficulty sleeping. 0    I have felt sad or miserable. 0    I have been so unhappy that I have been crying. 0    The thought of harming myself has occurred to me. 0             Review of Systems Pertinent items are noted in HPI.  Objective:  BP 106/71   Pulse 72   Ht 5\' 7"  (1.702 m)   Wt 171 lb (77.6 kg)   LMP 05/12/2023   BMI 26.78 kg/m    General: NAD  See procedure note for nexplanon insertion in the RUE  Assessment:   Routine PP care.   Plan:  *PP: declines pap today. Can do at annual in 2-3 months. Patient never been on nexplanon; d/w her re: r/b/a and she would like to proceed. Nexplanon placed today w/o issue. Pt told to consider effective in one week.  - Last pap smear  Diagnosis  Date Value Ref Range Status  03/21/2018   Final   NEGATIVE FOR INTRAEPITHELIAL LESIONS OR MALIGNANCY.  03/21/2018   Final   FUNGAL ORGANISMS PRESENT CONSISTENT WITH CANDIDA SPP.  *H/o severe  pre-eclampsia: pt states still on procardia 30 qday. Told her to d/c and RTC 7-10 for bp check  RTC: 7-10d for RN BP check and 2-3 months for annual with pap.   [place] Bing, MD Center for [employer], [facility]

## 2024-02-26 NOTE — Addendum Note (Signed)
 Addended by: Henrietta Dine on: 02/26/2024 12:19 PM   Modules accepted: Orders

## 2024-02-29 DIAGNOSIS — Z419 Encounter for procedure for purposes other than remedying health state, unspecified: Secondary | ICD-10-CM | POA: Diagnosis not present

## 2024-03-04 ENCOUNTER — Other Ambulatory Visit: Payer: Self-pay

## 2024-03-04 ENCOUNTER — Ambulatory Visit: Payer: Medicaid Other | Admitting: *Deleted

## 2024-03-04 VITALS — BP 116/74 | HR 79 | Ht 67.0 in

## 2024-03-04 DIAGNOSIS — Z0130 Encounter for examination of blood pressure without abnormal findings: Secondary | ICD-10-CM

## 2024-03-04 NOTE — Progress Notes (Signed)
 Pt presents for BP check following d/c of procardia on 2/26.  Pt had PP visit on that Kelci Petrella. BP- 116/74, P- 79.  She denies H/A or visual disturbances.  Pt was advised that she may remain off of procardia. Pt stated that Dr. Vergie Living agreed to give letter in order to expedite her parents in obtaining a visa in order to provide help with care of newborn and her other 3 children. When she left office on 2/26, she forgot to get the letter. I composed letter for pt today as requested. She voiced understanding and had no questions.

## 2024-03-17 ENCOUNTER — Ambulatory Visit (HOSPITAL_COMMUNITY)
Admission: EM | Admit: 2024-03-17 | Discharge: 2024-03-17 | Disposition: A | Discharge status: Home / Self Care | Attending: Internal Medicine | Admitting: Internal Medicine

## 2024-03-17 ENCOUNTER — Encounter (HOSPITAL_COMMUNITY): Payer: Self-pay

## 2024-03-17 DIAGNOSIS — J069 Acute upper respiratory infection, unspecified: Secondary | ICD-10-CM | POA: Diagnosis not present

## 2024-03-17 DIAGNOSIS — R42 Dizziness and giddiness: Secondary | ICD-10-CM

## 2024-03-17 DIAGNOSIS — R112 Nausea with vomiting, unspecified: Secondary | ICD-10-CM

## 2024-03-17 DIAGNOSIS — M791 Myalgia, unspecified site: Secondary | ICD-10-CM | POA: Diagnosis not present

## 2024-03-17 HISTORY — DX: Iron deficiency: E61.1

## 2024-03-17 HISTORY — DX: Essential (primary) hypertension: I10

## 2024-03-17 LAB — POCT RAPID STREP A (OFFICE): Rapid Strep A Screen: NEGATIVE

## 2024-03-17 LAB — POC COVID19/FLU A&B COMBO
Covid Antigen, POC: NEGATIVE
Influenza A Antigen, POC: NEGATIVE
Influenza B Antigen, POC: NEGATIVE

## 2024-03-17 MED ORDER — MECLIZINE HCL 12.5 MG PO TABS: 12.5000 mg | ORAL_TABLET | Freq: Three times a day (TID) | ORAL | 0 refills | Status: DC | PRN

## 2024-03-17 MED ORDER — ONDANSETRON 4 MG PO TBDP: 4.0000 mg | ORAL_TABLET | Freq: Three times a day (TID) | ORAL | 0 refills | Status: DC | PRN

## 2024-03-17 NOTE — Discharge Instructions (Addendum)
 Your strep, covid and flu tests are negative You just have an early cold The dizziness is related to fluid in the inner ear, and this is why you feel off balance. So I am sending medication to help with this, as well as nausea medicine.  You may take Advil over the counter for the body aches  Call your family doctor tomorrow, and have a visit with them to check your anemia, which her labs showed after having her baby.

## 2024-03-17 NOTE — ED Provider Notes (Signed)
 [facility]    CSN: [id number] Arrival date & time: 03/17/24  1749      History   Chief Complaint Chief Complaint  Patient presents with   Dizziness   Neck Pain   Emesis    HPI Allison Holmes is a 38 y.o. female.  Back of neck pain  2 days ago. She is  breast feeding her infant.  L ear pain since yesterday 'Dizziness feels like off balance.  Vomited x 1 yesterday ST since yesterday, and nose congestion. Has slight cough. Has body aches since today. Her legs feel heavy when she stands.       Past Medical History:  Diagnosis Date   Hypertension    Low iron    Medical history non-contributory     Patient Active Problem List   Diagnosis Date Noted   Severe pre-eclampsia 01/13/2024   Language barrier affecting health care 03/22/2018    Past Surgical History:  Procedure Laterality Date   INSERTION OF IMPLANON ROD  02/26/2024   NO PAST SURGERIES      OB History     Gravida  4   Para  4   Term  3   Preterm  1   AB  0   Living  4      SAB  0   IAB  0   Ectopic  0   Multiple  0   Live Births  4            Home Medications    Prior to Admission medications   Medication Sig Start Date End Date Taking? Authorizing Provider  meclizine (ANTIVERT) 12.5 MG tablet Take 1 tablet (12.5 mg total) by mouth 3 (three) times daily as needed for dizziness. 03/17/24  Yes Rodriguez-Southworth, Nettie Elm, PA-C  ondansetron (ZOFRAN-ODT) 4 MG disintegrating tablet Take 1 tablet (4 mg total) by mouth every 8 (eight) hours as needed for nausea or vomiting. 03/17/24  Yes Rodriguez-Southworth, Nettie Elm, PA-C  etonogestrel (NEXPLANON) 68 MG IMPL implant 1 each by Subdermal route once.    [provider]  vitamin C (ASCORBIC ACID) 250 MG tablet Take 250 mg by mouth daily.    [provider]    Family History Family History  Problem Relation Age of Onset   Hypertension Father    Cancer Neg Hx    Diabetes Neg Hx      Social History Social History   Tobacco Use   Smoking status: Never    Passive exposure: Never   Smokeless tobacco: Never  Vaping Use   Vaping status: Never Used  Substance Use Topics   Alcohol use: No   Drug use: Never     Allergies   Pork-derived products   Review of Systems Review of Systems As noted in HPI  Physical Exam Triage Vital Signs ED Triage Vitals  Encounter Vitals Group     BP 03/17/24 1911 111/77     Systolic BP Percentile --      Diastolic BP Percentile --      Pulse Rate 03/17/24 1911 82     Resp 03/17/24 1911 14     Temp 03/17/24 1911 98.9 F (37.2 C)     Temp Source 03/17/24 1911 Oral     SpO2 03/17/24 1911 96 %     Weight --      Height --      Head Circumference --      Peak Flow --  Pain Score 03/17/24 1916 7     Pain Loc --      Pain Education --      Exclude from Growth Chart --    No data found.  Updated Vital Signs BP 111/77 (BP Location: Right Arm)   Pulse 82   Temp 98.9 F (37.2 C) (Oral)   Resp 14   SpO2 96%   Breastfeeding Yes   Visual Acuity Right Eye Distance:   Left Eye Distance:   Bilateral Distance:    Right Eye Near:   Left Eye Near:    Bilateral Near:     Physical Exam Physical Exam Vitals signs and nursing note reviewed.  Constitutional:      General: she is not in acute distress.    Appearance: she is well-developed and normal weight. She is not ill-appearing, toxic-appearing or diaphoretic.  HENT:     Head: Normocephalic. TM's clear. Pharynx is slightly erythematous.  Eyes:     Extraocular Movements: Extraocular movements intact.     Pupils: Pupils are equal, round, and reactive to light.  Neck:     Musculoskeletal: Neck supple. No neck rigidity. Trapezius muscles are sore.     Cardiovascular:     Rate and Rhythm: Normal rate and regular rhythm.     Heart sounds: No murmur.  Pulmonary:     Effort: Pulmonary effort is normal.     Breath sounds: Normal breath sounds. No wheezing,  rhonchi or rales.  Abdominal:     General: Bowel sounds are normal.     Palpations: Abdomen is soft. There is no mass.     Tenderness: There is no abdominal tenderness. There is no guarding.  Musculoskeletal: Normal range of motion.  Lymphadenopathy:     Cervical: No cervical adenopathy.  Skin:    General: Skin is warm and dry.  Neurological:     Mental Status: she is alert.     Cranial Nerves: No cranial nerve deficit or facial asymmetry.     Sensory: No sensory deficit.     Motor: No weakness.     Coordination: Romberg sign negative. Coordination normal.     Gait: Gait normal.     Deep Tendon Reflexes: Reflexes normal.     Comments: Normal Romberg, propioception, finger to nose, tandem gait: was a little wobbly.   Psychiatric:        Mood and Affect: Mood normal.        Speech: Speech normal.        Behavior: Behavior normal.    UC Treatments / Results  Labs (all labs ordered are listed, but only abnormal results are displayed) Labs Reviewed  POC COVID19/FLU A&B COMBO - Normal  POCT RAPID STREP A (OFFICE)   Negative Strep and Covid/Flu tests  EKG   Radiology No results found.  Procedures Procedures (including critical care time)  Medications Ordered in UC Medications - No data to display  Initial Impression / Assessment and Plan / UC Course  I have reviewed the triage vital signs and the nursing notes.  Pertinent labs results that were available during my care of the patient were reviewed by me and considered in my medical decision making (see chart for details).  URI Flu like symptoms Dizziness  I placed her on Meclezine and Zofran as noted See instructions.    Final Clinical Impressions(s) / UC Diagnoses   Final diagnoses:  Nausea and vomiting, unspecified vomiting type  Myalgia  Dizziness and giddiness  Acute upper respiratory infection  Discharge Instructions      Your strep, covid and flu tests are negative You just have an early  cold The dizziness is related to fluid in the inner ear, and this is why you feel off balance. So I am sending medication to help with this, as well as nausea medicine.  You may take Advil over the counter for the body aches  Call your family doctor tomorrow, and have a visit with them to check your anemia, which her labs showed after having her baby.      ED Prescriptions     Medication Sig Dispense Auth. Provider   meclizine (ANTIVERT) 12.5 MG tablet Take 1 tablet (12.5 mg total) by mouth 3 (three) times daily as needed for dizziness. 21 tablet Rodriguez-Southworth, Avir Deruiter, PA-C   ondansetron (ZOFRAN-ODT) 4 MG disintegrating tablet Take 1 tablet (4 mg total) by mouth every 8 (eight) hours as needed for nausea or vomiting. 10 tablet Rodriguez-Southworth, Nettie Elm, PA-C      PDMP not reviewed this encounter.   Garey Ham, PA-C 03/18/24 1406

## 2024-03-17 NOTE — ED Triage Notes (Addendum)
 Per Interpreter-Fouad #[id number]  Patient states that she felt dizzy and had N/V yesterday and states she felt nauseated until 0300 today.  Patient states she has had pain in the back of her neck, left ear pain,  and dizziness.   Patient has not taken any medications for her symptoms.

## 2024-04-11 DIAGNOSIS — Z419 Encounter for procedure for purposes other than remedying health state, unspecified: Secondary | ICD-10-CM | POA: Diagnosis not present

## 2024-04-28 IMAGING — US US MFM OB FOLLOW-UP
1 series · 14 of 28 positions shown · non-contrast
Comparison: none

[Series 1: us mfm ob follow-up · 41 acquisitions, 14 frames shown]
[im 2/41]
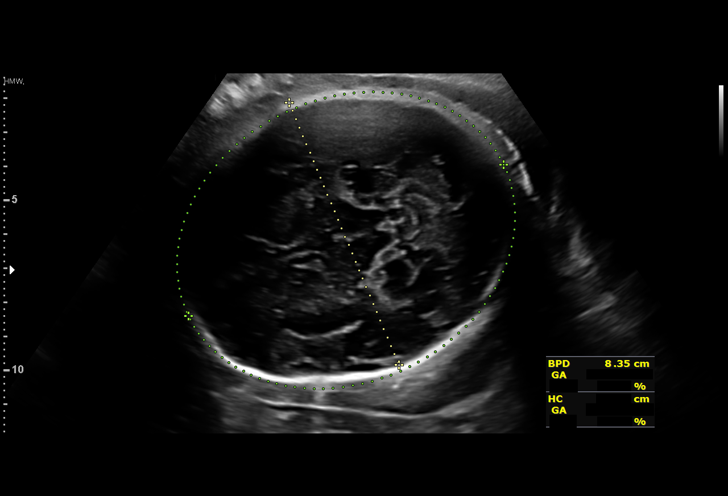
[im 5/41]
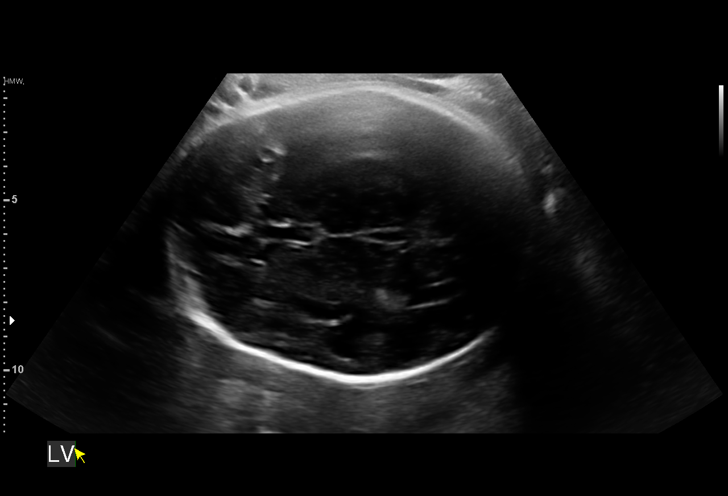
[im 8/41]
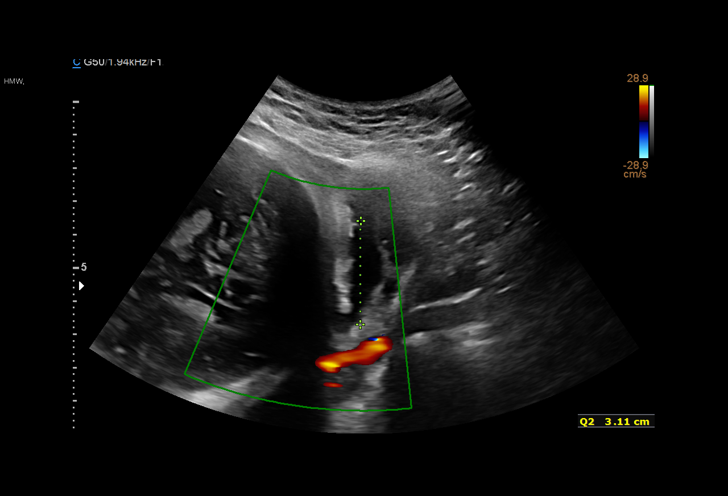
[im 11/41]
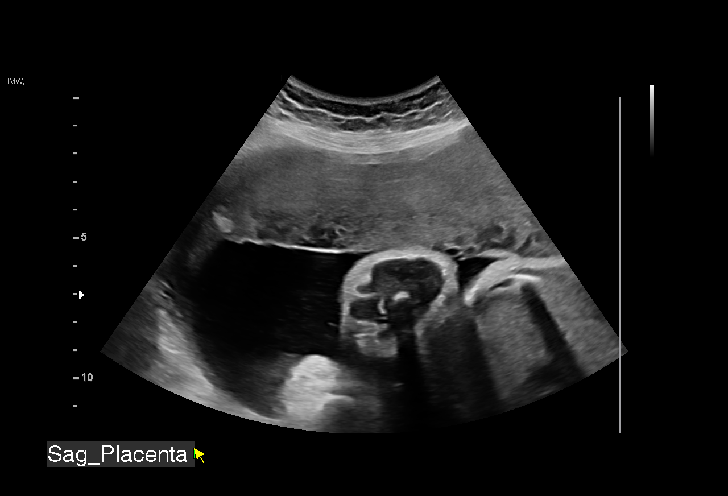
[im 14/41]
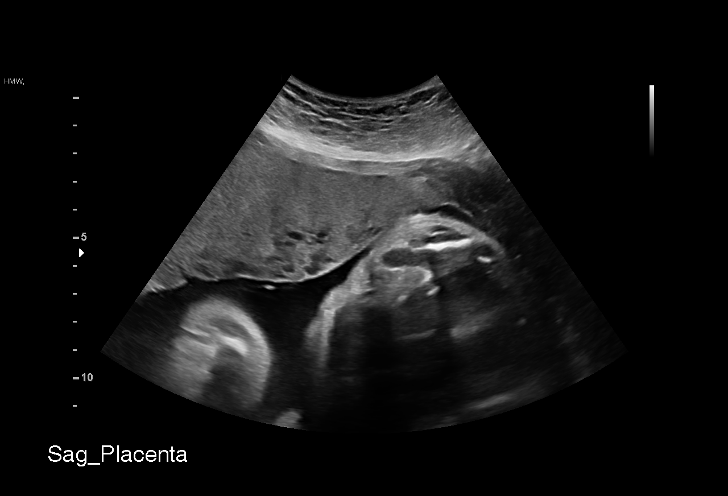
[im 17/41]
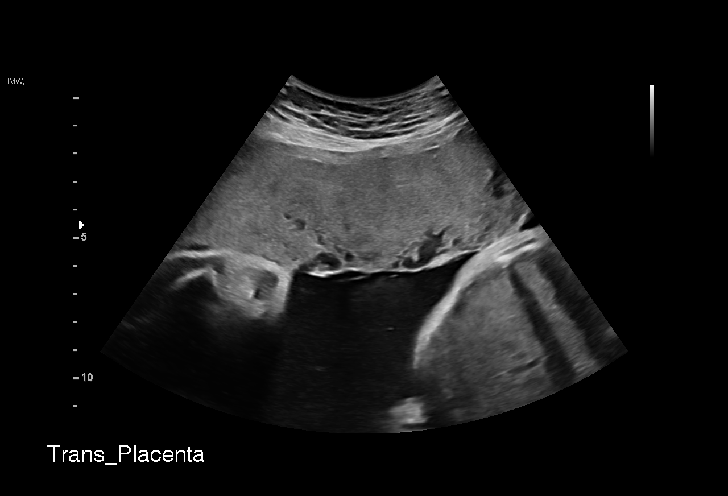
[im 20/41]
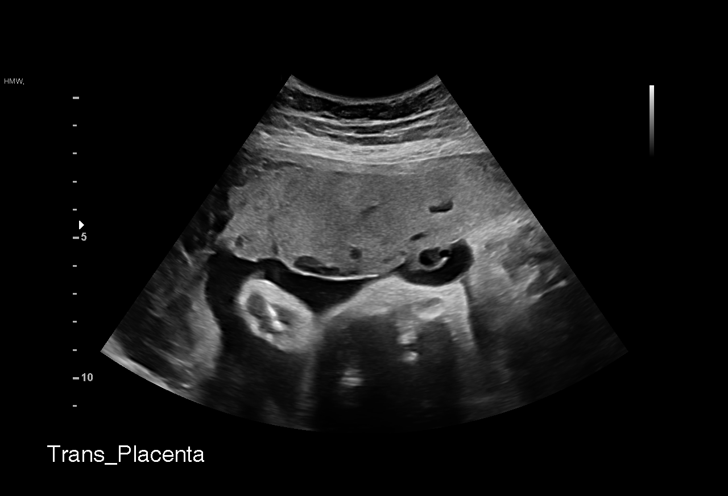
[im 23/41]
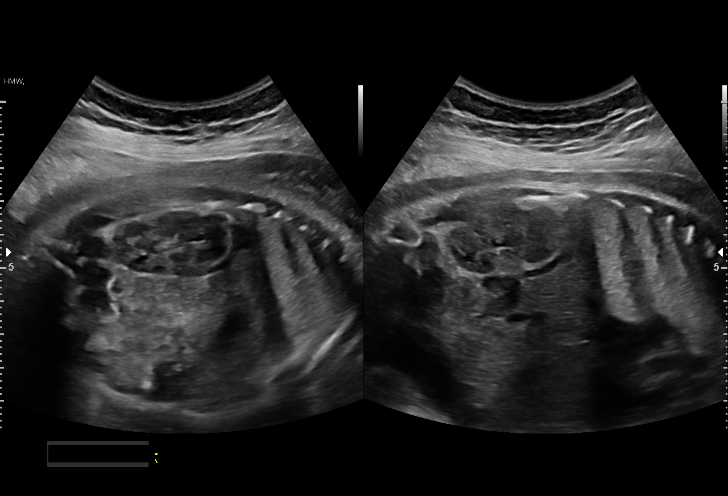
[im 26/41]
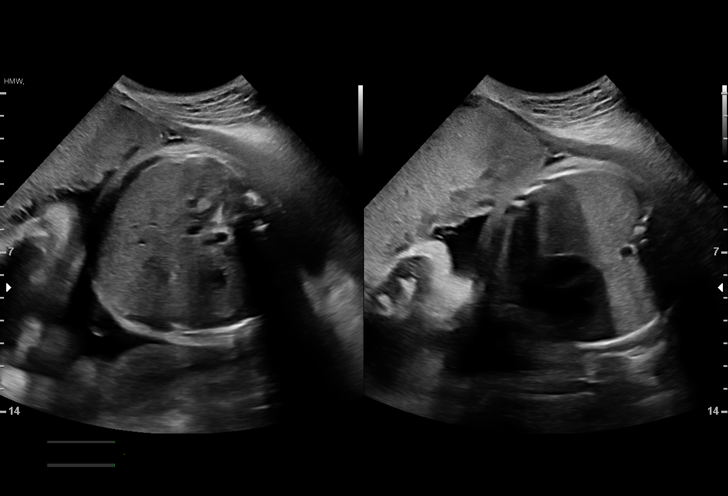
[im 29/41]
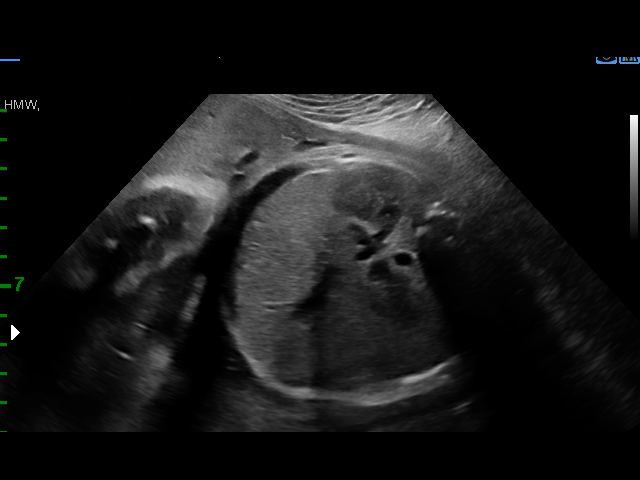
[im 32/41]
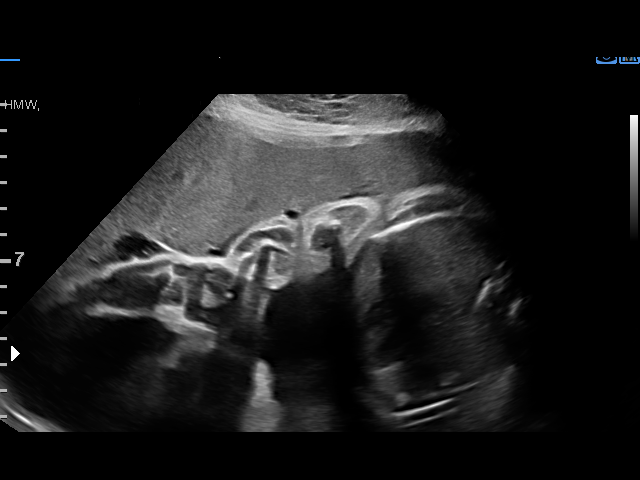
[im 35/41]
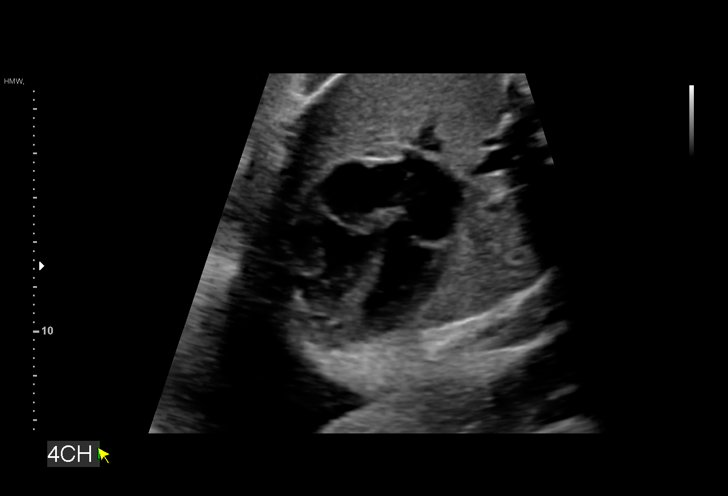
[im 38/41]
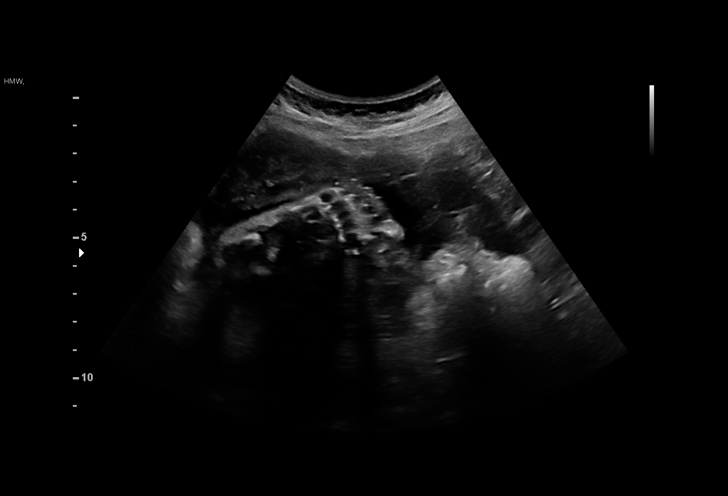
[im 41/41]
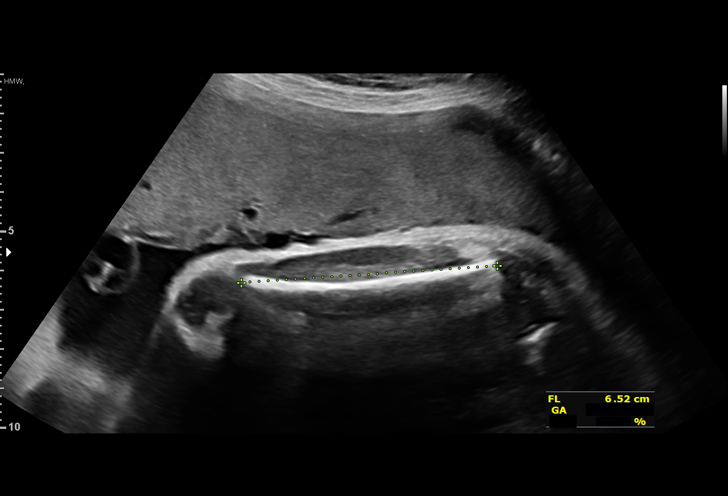

[14 of 28 positions shown; findings below may reference images not displayed]

TILLE

Indications

 Advanced maternal age primigravida 35+,
 third trimester
 32 weeks gestation of pregnancy
 Neg AFP/LR NIPS/Negative Horizon
Fetal Evaluation

 Num Of Fetuses:         1
 Fetal Heart Rate(bpm):  160
 Cardiac Activity:       Observed
 Presentation:           Cephalic
 Placenta:               Anterior
 P. Cord Insertion:      Visualized, central

 Amniotic Fluid
 AFI FV:      Within normal limits

 AFI Sum(cm)     %Tile       Largest Pocket(cm)
 15.6            56

 RUQ(cm)       RLQ(cm)       LUQ(cm)        LLQ(cm)

Biometry
 BPD:      83.6  mm     G. Age:  33w 5d         74  %    CI:        75.54   %    70 - 86
                                                         FL/HC:      21.4   %    19.9 -
 HC:       305   mm     G. Age:  34w 0d         49  %    HC/AC:      1.05        0.96 -
 AC:      290.1  mm     G. Age:  33w 0d         63  %    FL/BPD:     78.2   %    71 - 87
 FL:       65.4  mm     G. Age:  33w 5d         69  %    FL/AC:      22.5   %    20 - 24

 Est. FW:    6049  gm    4 lb 13 oz      66  %
OB History

 Gravidity:    3         Term:   2
 Living:       2
Gestational Age

 LMP:           33w 5d        Date:  09/16/21                 EDD:   06/23/22
 Clinical EDD:  33w 5d                                        EDD:   06/23/22
 U/S Today:     33w 4d                                        EDD:   06/24/22
 Best:          32w 4d     Det. By:  U/S  (02/07/22)          EDD:   07/01/22
Anatomy

 Cranium:               Appears normal         Aortic Arch:            Previously seen
 Cavum:                 Previously seen        Ductal Arch:            Previously seen
 Ventricles:            Appears normal         Diaphragm:              Previously seen
 Choroid Plexus:        Previously seen        Stomach:                Appears normal, left
                                                                       sided
 Cerebellum:            Previously seen        Abdomen:                Previously seen
 Posterior Fossa:       Previously seen        Abdominal Wall:         Previously seen
 Nuchal Fold:           Not applicable (>20    Cord Vessels:           Previously seen
                        wks GA)
 Face:                  Orbits and profile     Kidneys:                Appear normal
                        previously seen
 Lips:                  Previously seen        Bladder:                Appears normal
 Thoracic:              Appears normal         Spine:                  Appears normal
 Heart:                 Appears normal         Upper Extremities:      Appears normal
                        (4CH, axis, and
                        situs)
 RVOT:                  Appears normal         Lower Extremities:      Appears normal
 LVOT:                  Previously seen

 Other:  Fetus appears to be female. Heels/feet and open hands/5th digits
         visualized. Nasal bone, 3VV and 3VTV previously visualized.
         Technically difficult due to fetal position.
Cervix Uterus Adnexa

 Cervix
 Not visualized (advanced GA >25wks)

 Uterus
 No abnormality visualized.

 Right Ovary
 No adnexal mass visualized.
 Left Ovary
 No adnexal mass visualized.

 Cul De Sac
 No free fluid seen.

 Adnexa
 No abnormality visualized.
Impression

 Advanced maternal age.
 Blood pressure today at her office is 110/66 mmHg.

 Fetal growth is appropriate for gestational age .Amniotic fluid
 is normal and good fetal activity is seen .
Recommendations

 Follow-up scans as clinically indicated.
                 Soluade, [place]

## 2024-05-11 DIAGNOSIS — Z419 Encounter for procedure for purposes other than remedying health state, unspecified: Secondary | ICD-10-CM | POA: Diagnosis not present

## 2024-06-11 DIAGNOSIS — Z419 Encounter for procedure for purposes other than remedying health state, unspecified: Secondary | ICD-10-CM | POA: Diagnosis not present

## 2024-07-11 DIAGNOSIS — Z419 Encounter for procedure for purposes other than remedying health state, unspecified: Secondary | ICD-10-CM | POA: Diagnosis not present

## 2024-08-04 DIAGNOSIS — Z0120 Encounter for dental examination and cleaning without abnormal findings: Secondary | ICD-10-CM | POA: Diagnosis not present

## 2024-08-11 DIAGNOSIS — Z419 Encounter for procedure for purposes other than remedying health state, unspecified: Secondary | ICD-10-CM | POA: Diagnosis not present

## 2024-09-02 DIAGNOSIS — Z1384 Encounter for screening for dental disorders: Secondary | ICD-10-CM | POA: Diagnosis not present

## 2024-09-11 DIAGNOSIS — Z419 Encounter for procedure for purposes other than remedying health state, unspecified: Secondary | ICD-10-CM | POA: Diagnosis not present

## 2024-09-20 ENCOUNTER — Other Ambulatory Visit: Payer: Self-pay | Admitting: Obstetrics & Gynecology

## 2024-09-20 DIAGNOSIS — O0990 Supervision of high risk pregnancy, unspecified, unspecified trimester: Secondary | ICD-10-CM

## 2024-09-28 ENCOUNTER — Other Ambulatory Visit: Payer: Self-pay | Admitting: Lactation Services

## 2024-09-28 MED ORDER — VITAFOL ULTRA 29-0.6-0.4-200 MG PO CAPS: 1.0000 | ORAL_CAPSULE | Freq: Every day | ORAL | 11 refills | Status: DC

## 2024-10-01 ENCOUNTER — Encounter: Payer: Self-pay | Admitting: Family Medicine

## 2024-10-01 ENCOUNTER — Ambulatory Visit: Admitting: Family Medicine

## 2024-10-01 VITALS — BP 102/72 | HR 92 | Temp 98.0°F | Ht 67.0 in | Wt 165.0 lb

## 2024-10-01 DIAGNOSIS — M542 Cervicalgia: Secondary | ICD-10-CM

## 2024-10-01 DIAGNOSIS — K047 Periapical abscess without sinus: Secondary | ICD-10-CM | POA: Diagnosis not present

## 2024-10-01 DIAGNOSIS — M25512 Pain in left shoulder: Secondary | ICD-10-CM | POA: Diagnosis not present

## 2024-10-01 DIAGNOSIS — E663 Overweight: Secondary | ICD-10-CM | POA: Diagnosis not present

## 2024-10-01 DIAGNOSIS — Z7689 Persons encountering health services in other specified circumstances: Secondary | ICD-10-CM

## 2024-10-01 DIAGNOSIS — M25511 Pain in right shoulder: Secondary | ICD-10-CM

## 2024-10-01 DIAGNOSIS — M545 Low back pain, unspecified: Secondary | ICD-10-CM | POA: Diagnosis not present

## 2024-10-01 DIAGNOSIS — Z862 Personal history of diseases of the blood and blood-forming organs and certain disorders involving the immune mechanism: Secondary | ICD-10-CM | POA: Diagnosis not present

## 2024-10-01 LAB — TSH: TSH: 0.81 u[IU]/mL (ref 0.35–5.50)

## 2024-10-01 LAB — COMPREHENSIVE METABOLIC PANEL WITH GFR
ALT: 12 U/L (ref 0–35)
AST: 17 U/L (ref 0–37)
Albumin: 4.2 g/dL (ref 3.5–5.2)
Alkaline Phosphatase: 103 U/L (ref 39–117)
BUN: 15 mg/dL (ref 6–23)
CO2: 26 meq/L (ref 19–32)
Calcium: 9.9 mg/dL (ref 8.4–10.5)
Chloride: 105 meq/L (ref 96–112)
Creatinine, Ser: 0.58 mg/dL (ref 0.40–1.20)
GFR: 113.73 mL/min (ref >60.00)
Glucose, Bld: 83 mg/dL (ref 70–99)
Potassium: 3.9 meq/L (ref 3.5–5.1)
Sodium: 140 meq/L (ref 135–145)
Total Bilirubin: 0.3 mg/dL (ref 0.2–1.2)
Total Protein: 8.4 g/dL — ABNORMAL HIGH (ref 6.0–8.3)

## 2024-10-01 LAB — CBC WITH DIFFERENTIAL/PLATELET
Basophils Absolute: 0 K/uL (ref 0.0–0.1)
Basophils Relative: 0.4 % (ref 0.0–3.0)
Eosinophils Absolute: 0.2 K/uL (ref 0.0–0.7)
Eosinophils Relative: 2.3 % (ref 0.0–5.0)
HCT: 39.8 % (ref 36.0–46.0)
Hemoglobin: 13.1 g/dL (ref 12.0–15.0)
Lymphocytes Relative: 36.7 % (ref 12.0–46.0)
Lymphs Abs: 2.9 K/uL (ref 0.7–4.0)
MCHC: 32.8 g/dL (ref 30.0–36.0)
MCV: 79.4 fl (ref 78.0–100.0)
Monocytes Absolute: 0.4 K/uL (ref 0.1–1.0)
Monocytes Relative: 5.4 % (ref 3.0–12.0)
Neutro Abs: 4.4 K/uL (ref 1.4–7.7)
Neutrophils Relative %: 55.2 % (ref 43.0–77.0)
Platelets: 293 K/uL (ref 150.0–400.0)
RBC: 5.01 Mil/uL (ref 3.87–5.11)
RDW: 13.4 % (ref 11.5–15.5)
WBC: 8 K/uL (ref 4.0–10.5)

## 2024-10-01 LAB — HEMOGLOBIN A1C: Hgb A1c MFr Bld: 6 % (ref 4.6–6.5)

## 2024-10-01 LAB — LIPID PANEL
Cholesterol: 166 mg/dL (ref 0–200)
HDL: 61.9 mg/dL (ref >39.00)
LDL Cholesterol: 90 mg/dL (ref 0–99)
NonHDL: 104.03
Total CHOL/HDL Ratio: 3
Triglycerides: 68 mg/dL (ref 0.0–149.0)
VLDL: 13.6 mg/dL (ref 0.0–40.0)

## 2024-10-01 MED ORDER — CEPHALEXIN 500 MG PO CAPS: 500.0000 mg | ORAL_CAPSULE | Freq: Three times a day (TID) | ORAL | 0 refills | Status: AC

## 2024-10-01 NOTE — Progress Notes (Unsigned)
 New Patient Office Visit  Subjective   Patient ID: Allison Holmes, female    DOB: [birth year]  Age: 38 y.o. MRN: [id number]  CC:  Chief Complaint  Patient presents with  . Establish Care  . Back Pain    HPI Ernest Izeldain Ibrahim Hilger presents to establish care with new provider.  Patients previous primary care provider: None   Specialist: [facility]  [facility]   Patient is complaining of dental pain. She is concerned that she has an infection.   Patient is complaining of cervical and bilateral shoulder pain. Started over two months ago. All of sudden. Intermittent. Described as a sharp pain. She reports she will massage with some relief and had Tylenol /Ibuprofen  that helped. Denies recent injury. She reports she has numbness and tingling in both thumb. However, the numbness started about 15 years ago.  Outpatient Encounter Medications as of 10/01/2024  Medication Sig  . cephALEXin (KEFLEX) 500 MG capsule Take 1 capsule (500 mg total) by mouth 3 (three) times daily for 7 days.  . etonogestrel  (NEXPLANON ) 68 MG IMPL implant 1 each by Subdermal route once.  . vitamin C (ASCORBIC ACID) 250 MG tablet Take 250 mg by mouth daily.  . [DISCONTINUED] meclizine  (ANTIVERT ) 12.5 MG tablet Take 1 tablet (12.5 mg total) by mouth 3 (three) times daily as needed for dizziness.  . [DISCONTINUED] ondansetron  (ZOFRAN -ODT) 4 MG disintegrating tablet Take 1 tablet (4 mg total) by mouth every 8 (eight) hours as needed for nausea or vomiting.  . [DISCONTINUED] Prenat-Fe Poly-Methfol-FA-DHA (VITAFOL  ULTRA) 29-0.6-0.4-200 MG CAPS Take 1 capsule by mouth daily.   No facility-administered encounter medications on file as of 10/01/2024.    Past Medical History:  Diagnosis Date  . GERD (gastroesophageal reflux disease)    when prgnant  . Hypertension   . Low iron   . Medical history non-contributory     Past  Surgical History:  Procedure Laterality Date  . INSERTION OF IMPLANON  ROD  02/26/2024  . NO PAST SURGERIES      Family History  Problem Relation Age of Onset  . Hypertension Father   . Cancer Neg Hx   . Diabetes Neg Hx     Social History   Socioeconomic History  . Marital status: Married    Spouse name: Not on file  . Number of children: 4  . Years of education: Not on file  . Highest education level: Bachelor's degree (e.g., BA, AB, BS)  Occupational History  . Not on file  Tobacco Use  . Smoking status: Never    Passive exposure: Never  . Smokeless tobacco: Never  Vaping Use  . Vaping status: Never Used  Substance and Sexual Activity  . Alcohol use: No  . Drug use: Never  . Sexual activity: Yes    Birth control/protection: Implant  Other Topics Concern  . Not on file  Social History Narrative  . Not on file   Social Drivers of Health   Financial Resource Strain: Medium Risk (09/30/2024)   Overall Financial Resource Strain (CARDIA)   . Difficulty of Paying Living Expenses: Somewhat hard  Food Insecurity: No Food Insecurity (10/01/2024)   Hunger Vital Sign   . Worried About Programme researcher, broadcasting/film/video in the Last Year: Never true   . Ran Out of Food in the Last Year: Never true  Transportation Needs: No Transportation Needs (09/30/2024)   PRAPARE - Transportation   . Lack  of Transportation (Medical): No   . Lack of Transportation (Non-Medical): No  Physical Activity: Inactive (09/30/2024)   Exercise Vital Sign   . Days of Exercise per Week: 0 days   . Minutes of Exercise per Session: Not on file  Stress: No Stress Concern Present (09/30/2024)   [employer] of Occupational Health - Occupational Stress Questionnaire   . Feeling of Stress: Not at all  Social Connections: Moderately Integrated (09/30/2024)   Social Connection and Isolation Panel   . Frequency of Communication with Friends and Family: More than three times a week   . Frequency of Social Gatherings with  Friends and Family: Three times a week   . Attends Religious Services: More than 4 times per year   . Active Member of Clubs or Organizations: No   . Attends Banker Meetings: Not on file   . Marital Status: Married  Catering manager Violence: Not At Risk (01/13/2024)   Humiliation, Afraid, Rape, and Kick questionnaire   . Fear of Current or Ex-Partner: No   . Emotionally Abused: No   . Physically Abused: No   . Sexually Abused: No    ROS See HPI above    Objective  BP 102/72   Pulse 92   Temp 98 F (36.7 C) (Oral)   Ht 5' 7 (1.702 m)   Wt 165 lb (74.8 kg)   SpO2 97%   BMI 25.84 kg/m   Physical Exam    Assessment & Plan:  Encounter to establish care  Dental abscess -     Cephalexin; Take 1 capsule (500 mg total) by mouth 3 (three) times daily for 7 days.  Dispense: 21 capsule; Refill: 0  Acute pain of both shoulders -     DG Shoulder Left; Future -     DG Shoulder Right; Future  Cervical pain (neck) -     DG Cervical Spine Complete; Future  Lumbar pain -     DG Lumbar Spine 2-3 Views; Future  Overweight -     CBC with Differential/Platelet -     Comprehensive metabolic panel with GFR -     Hemoglobin A1c -     Lipid panel -     TSH  History of anemia -     Iron, TIBC and Ferritin Panel  1.Review health maintenance:  -Covid booster: Declines  -Hep B vaccine: Thinks she had  -HPV vaccine: Declines  -Influenza vaccine: Declines  -Cervical cancer screening: 2019; recommend to make an appointment with GYN to have annual exam.  Return in about 2 weeks (around 10/15/2024) for  .   Aysiah Jurado, NP

## 2024-10-01 NOTE — Patient Instructions (Addendum)
-  It was a pleasure to meet you today and look forward to taking care of you. -Please schedule an appointment with GYN to have cervical cancer screening (pap smear).  -Prescribed Cephalexin 500mg  tablet, take 1 tablet every 8 hours for 7 days for dental abscess. Recommend to go back [facility].  -Ordered x-ray on neck, shoulders, and lumbar pain. Please make an appointment to have these completed.  -Alternate Tylenol  1000mg  and Ibuprofen  600-800mg  every 4 hours for pain. Recommend to eat something when taking Ibuprofen .   -May use heat compresses 4-6 times a day, up to 20 minutes at a time.  -Ordered labs. Office will call with lab results and will be available MyChart. -Follow up in 2 weeks.

## 2024-10-02 ENCOUNTER — Other Ambulatory Visit

## 2024-10-02 ENCOUNTER — Ambulatory Visit

## 2024-10-02 ENCOUNTER — Ambulatory Visit: Payer: Self-pay | Admitting: Family Medicine

## 2024-10-02 DIAGNOSIS — M25511 Pain in right shoulder: Secondary | ICD-10-CM

## 2024-10-02 DIAGNOSIS — M542 Cervicalgia: Secondary | ICD-10-CM | POA: Diagnosis not present

## 2024-10-02 DIAGNOSIS — M545 Low back pain, unspecified: Secondary | ICD-10-CM

## 2024-10-02 DIAGNOSIS — M25512 Pain in left shoulder: Secondary | ICD-10-CM

## 2024-10-02 DIAGNOSIS — M955 Acquired deformity of pelvis: Secondary | ICD-10-CM

## 2024-10-02 LAB — IRON,TIBC AND FERRITIN PANEL
%SAT: 27 % (ref 16–45)
Ferritin: 52 ng/mL (ref 16–154)
Iron: 97 ug/dL (ref 40–190)
TIBC: 361 ug/dL (ref 250–450)

## 2024-10-06 ENCOUNTER — Ambulatory Visit: Admitting: Family Medicine

## 2024-10-11 DIAGNOSIS — Z419 Encounter for procedure for purposes other than remedying health state, unspecified: Secondary | ICD-10-CM | POA: Diagnosis not present

## 2024-10-16 ENCOUNTER — Ambulatory Visit: Admitting: Family Medicine

## 2024-10-19 ENCOUNTER — Ambulatory Visit (INDEPENDENT_AMBULATORY_CARE_PROVIDER_SITE_OTHER): Admitting: Family Medicine

## 2024-10-19 ENCOUNTER — Encounter: Payer: Self-pay | Admitting: Family Medicine

## 2024-10-19 VITALS — BP 116/70 | HR 95 | Temp 98.2°F | Ht 67.0 in | Wt 167.0 lb

## 2024-10-19 DIAGNOSIS — K002 Abnormalities of size and form of teeth: Secondary | ICD-10-CM

## 2024-10-19 DIAGNOSIS — M4126 Other idiopathic scoliosis, lumbar region: Secondary | ICD-10-CM

## 2024-10-19 DIAGNOSIS — M955 Acquired deformity of pelvis: Secondary | ICD-10-CM | POA: Diagnosis not present

## 2024-10-19 NOTE — Patient Instructions (Addendum)
-  It was nice to see you today.  -Placed another referral to ortho for your back pain. Please call the office or send a MyChart message if you do not receive a phone call or a MyChart message about appointment in 2 weeks.  -Printed the referral letter to physical therapy. Please call to schedule an appointment. -Printed a letter to have a dental bridge instead of a partial.  -Follow up in 1 year for physical or sooner.

## 2024-10-19 NOTE — Progress Notes (Signed)
 Established Patient Office Visit   Subjective:  Patient ID: Allison Holmes, female    DOB: 1986-01-05  Age: 38 y.o. MRN: [id number]  Chief Complaint  Patient presents with   Back Pain   HPI: Patient is following up of cervical, bilateral shoulder pain, and lumbar pain. At previous appointment, started over two months ago. All of sudden. Intermittent. Described as a sharp pain. She reports she will massage with some relief and had Tylenol /Ibuprofen  that helped. Denies recent injury. She reports she had numbness and tingling in both thumbs. However, the numbness started about 15 years ago. At last appointment, a cervical spine, bilateral shoulder, and lumbar spine x-ray completed. She had moderate broad-based levo scoliotic curvature and mild sclerosis about the left sacroiliac joint. She was referred to ortho and physical therapy. When orthopedic called to schedule an appointment, she declined the appointment. She has not made a physical therapy appointment.   During visit, spoke with Landis Buster with [facility] [place] that patient had called. She reports patient needs a letter addressed to [facility] to have a bridge instead of a partial due to her physical, mental and emotional health. She reports Medicaid will cover the cost if she has a letter from her PCP.  Review of Systems  Musculoskeletal:  Positive for back pain.   See HPI above     Objective:   BP 116/70   Pulse 95   Temp 98.2 F (36.8 C) (Oral)   Ht 5' 7 (1.702 m)   Wt 167 lb (75.8 kg)   SpO2 98%   BMI 26.16 kg/m    Physical Exam Vitals reviewed.  Constitutional:      General: She is not in acute distress.    Appearance: Normal appearance. She is not ill-appearing, toxic-appearing or diaphoretic.  HENT:     Head: Normocephalic and atraumatic.  Eyes:     General:        Right eye: No discharge.        Left eye: No discharge.      Conjunctiva/sclera: Conjunctivae normal.  Cardiovascular:     Rate and Rhythm: Normal rate.  Pulmonary:     Effort: Pulmonary effort is normal. No respiratory distress.  Musculoskeletal:        General: Normal range of motion.  Skin:    General: Skin is warm and dry.  Neurological:     General: No focal deficit present.     Mental Status: She is alert and oriented to person, place, and time. Mental status is at baseline.  Psychiatric:        Mood and Affect: Mood normal.        Behavior: Behavior normal.        Thought Content: Thought content normal.        Judgment: Judgment normal.      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  -Printed the referral letter from physical therapy to schedule an appointment.  -Created a letter to the dentist for a bridge instead of a partial. It is in MyChart. Spoke back with Mikayla after visit to let her know we could not email the letter, but it would be on  her MyChart account.  May contact Landis Buster [phone] with [facility] [place].   Return in about 1 year (around 10/19/2025) for physical.   Jaquasia Doscher, NP

## 2024-11-25 ENCOUNTER — Other Ambulatory Visit (INDEPENDENT_AMBULATORY_CARE_PROVIDER_SITE_OTHER): Payer: Self-pay

## 2024-11-25 ENCOUNTER — Ambulatory Visit: Admitting: Orthopedic Surgery

## 2024-11-25 VITALS — BP 103/67 | Ht 67.0 in | Wt 167.0 lb

## 2024-11-25 DIAGNOSIS — G8929 Other chronic pain: Secondary | ICD-10-CM

## 2024-11-25 DIAGNOSIS — M545 Low back pain, unspecified: Secondary | ICD-10-CM

## 2024-11-25 NOTE — Progress Notes (Signed)
 Orthopedic Spine Surgery Office Note  Assessment: Patient is a 38 y.o. female with chronic, low back pain.  No radicular symptoms.  Has a lumbar scoliosis that appears well-balanced sagittally and coronally   Plan: -Explained that initially conservative treatment is tried as a significant number of patients may experience relief with these treatment modalities. Discussed that the conservative treatments include:  -activity modification  -physical therapy  -over the counter pain medications  -medrol dosepak  -lumbar steroid injections -Patient has tried no treatments so far  -Recommended core strengthening and physical therapy.  She can use Tylenol  up to 1000 mg 3 times daily.  She should also use lidocaine  patches -Patient should return to office in 8 weeks, x-rays at next visit: none   Patient expressed understanding of the plan and all questions were answered to the patient's satisfaction.   ___________________________________________________________________________   History:  Patient is a 38 y.o. female who presents today for lumbar spine.  Patient has had about 8 years of lumbar back pain.  She said that it started after childbirth.  She feels it in the lower lumbar region.  She has no pain radiating into either lower extremity.  She notes the pain mostly when she is bending.  She does not have as much pain when she is sitting or laying down.  She has not tried any treatments for this pain.  She has been previously told that she has scoliosis.   Weakness: denies Symptoms of imbalance: denies Paresthesias and numbness: denies Bowel or bladder incontinence: denies Saddle anesthesia: denies  Treatments tried: no treatments so far  Review of systems: Denies fevers and chills, night sweats, unexplained weight loss, history of cancer, pain that wakes them at night  Past medical history: none  Allergies: NKDA  Past surgical history:  none  Social history: Denies use of  nicotine product (smoking, vaping, patches, smokeless) Alcohol use: denies Denies recreational drug use   Physical Exam:  BMI of 26.2  General: no acute distress, appears stated age Neurologic: alert, answering questions appropriately, following commands Respiratory: unlabored breathing on room air, symmetric chest rise Psychiatric: appropriate affect, normal cadence to speech   MSK (spine):  -Strength exam      Left  Right EHL    5/5  5/5 TA    5/5  5/5 GSC    5/5  5/5 Knee extension  5/5  5/5 Hip flexion   5/5  5/5  -Sensory exam    Sensation intact to light touch in L3-S1 nerve distributions of bilateral lower extremities  -Achilles DTR: 2/4 on the left, 2/4 on the right -Patellar tendon DTR: 2/4 on the left, 2/4 on the right  -Straight leg raise: negative bilaterally -Clonus: no beats bilaterally  -Left hip exam: no pain through range of motion -Right hip exam: no pain through range of motion  Imaging: XRs scoliosis from 11/25/2024 were independently reviewed and interpreted, showing pelvic obliquity.  No significant coronal imbalance.  There is a lumbar scoliosis with apex to the left that measures 34 degrees.  Patient has negative sagittal balance of 4 cm.  No significant disc at loss seen in the lumbar spine.  No fracture or dislocation seen.   Patient name: Allison Holmes Patient MRN: [id number] Date of visit: 11/25/24

## 2024-12-09 NOTE — Therapy (Incomplete)
 OUTPATIENT PHYSICAL THERAPY FEMALE PELVIC EVALUATION   Patient Name: Allison Holmes MRN: [id number] DOB:1986/05/15, 38 y.o., female Today's Date: 12/09/2024  END OF SESSION:   Past Medical History:  Diagnosis Date   GERD (gastroesophageal reflux disease)    when prgnant   Hypertension    Low iron    Medical history non-contributory    Past Surgical History:  Procedure Laterality Date   INSERTION OF IMPLANON  ROD  02/26/2024   NO PAST SURGERIES     Patient Active Problem List   Diagnosis Date Noted   Severe pre-eclampsia 01/13/2024   Language barrier affecting health care 03/22/2018    PCP: Billy Philippe SAUNDERS, NP  REFERRING PROVIDER: Georgina Ozell LABOR, MD  REFERRING DIAG: M54.50,G89.29 (ICD-10-CM) - Chronic low back pain without sciatica, unspecified back pain laterality  THERAPY DIAG:  No diagnosis found.  Rationale for Evaluation and Treatment: Rehabilitation  ONSET DATE: ***  SUBJECTIVE:                                                                                                                                                                                           SUBJECTIVE STATEMENT: *** Fluid intake:   FUNCTIONAL LIMITATIONS: ***  PERTINENT HISTORY:  Medications for current condition: *** Surgeries: *** Other: *** Sexual abuse: {Yes/No:304960894}  PAIN:  Are you having pain? {yes/no:20286} NPRS scale: ***/10 Pain location: {pelvic pain location:27098}  Pain type: {type:313116} Pain description: {PAIN DESCRIPTION:21022940}   Aggravating factors: *** Relieving factors: ***  PRECAUTIONS: {Therapy precautions:24002}  RED FLAGS: {PT Red Flags:29287}   WEIGHT BEARING RESTRICTIONS: {Yes ***/No:24003}  FALLS:  Has patient fallen in last 6 months? {fallsyesno:27318}  OCCUPATION: ***  ACTIVITY LEVEL : ***  PLOF: {PLOF:24004}  PATIENT GOALS: ***   BOWEL MOVEMENT: Pain with bowel movement: {yes/no:20286} Type of  bowel movement:{PT BM type:27100} Fully empty rectum: {No/Yes:304960894} Leakage: {Yes/No:304960894}                                                  Caused by: *** Bowel urgency: *** Pads: {Yes/No:304960894} Fiber supplement/laxative {YES/NO AS:20300}  URINATION: Pain with urination: {yes/no:20286} Fully empty bladder: {Yes/No:304960894}***                                         Post-void dribble: {YES/NO AS:20300} Stream: {PT urination:27102} Urgency: {YES/NO AS:20300} Frequency:during the day ***  Nocturia: {Yes/No:304960894}***   Leakage: {PT leakage:27103} Pads/briefs: {Yes/No:304960894}  INTERCOURSE:  Ability to have vaginal penetration {YES/NO:21197} Pain with intercourse: {pain with intercourse PA:27099} Dryness: {YES/NO AS:20300} Climax: *** Marinoff Scale: ***/3 Lubricant:  PREGNANCY: Vaginal deliveries *** Tearing {Yes***/No:304960894} Episiotomy {YES/NO AS:20300} C-section deliveries *** Currently pregnant {Yes***/No:304960894}  PROLAPSE: {PT prolapse:27101}   OBJECTIVE:  Note: Objective measures were completed at Evaluation unless otherwise noted.  DIAGNOSTIC FINDINGS:  Post-void residual: Voiding Cystourethrogram (VCUG):  Ultrasound: ***  PATIENT SURVEYS:  {rehab surveys:24030}  PFIQ-7: *** UIQ-7 *** CRAIG -7 *** POPIQ-7 *** Female Sexual Function Index (FSFI) Questionnaire ***  COGNITION: Overall cognitive status: {cognition:24006}     SENSATION: Light touch: {intact/deficits:24005}  LUMBAR SPECIAL TESTS:  {lumbar special test:25242}  FUNCTIONAL TESTS:  {Functional tests:24029} Single leg stance:  Rt:  Lt: Sit-up test: Squat: Bed mobility:  GAIT: Assistive device utilized: {Assistive devices:23999} Comments: ***  POSTURE: {posture:25561}   LUMBARAROM/PROM:  A/PROM A/PROM  Eval (% available)  Flexion   Extension   Right lateral flexion   Left lateral flexion    Right rotation   Left rotation    (Blank rows = not tested)  LOWER EXTREMITY ROM:  {AROM/PROM:27142} ROM Right eval Left eval  Hip flexion    Hip extension    Hip abduction    Hip adduction    Hip internal rotation    Hip external rotation    Knee flexion    Knee extension    Ankle dorsiflexion    Ankle plantarflexion    Ankle inversion    Ankle eversion     (Blank rows = not tested)  LOWER EXTREMITY MMT:  MMT Right eval Left eval  Hip flexion    Hip extension    Hip abduction    Hip adduction    Hip internal rotation    Hip external rotation    Knee flexion    Knee extension    Ankle dorsiflexion    Ankle plantarflexion    Ankle inversion    Ankle eversion     (Blank rows = not tested) PALPATION:  General: ***  Pelvic Alignment: ***  Abdominal: ***  Diastasis: {Yes/No:304960894}*** Distortion: {YES/NO AS:20300}  Breathing: *** Scar tissue: {Yes/No:304960894}*** Active Straight Leg Raise: ***                External Perineal Exam: ***                             Internal Pelvic Floor: ***  Patient confirms identification and approves PT to assess internal pelvic floor and treatment {yes/no:20286} All internal or external pelvic floor assessments and/or treatments are completed with proper hand hygiene and gloves hands. If needed gloves are changed with hand hygiene during patient care time.  PELVIC MMT:   MMT eval  Vaginal   Internal Anal Sphincter   External Anal Sphincter   Puborectalis   (Blank rows = not tested)        TONE: ***  PROLAPSE: ***  TODAY'S TREATMENT:  DATE: ***  EVAL ***   PATIENT EDUCATION:  Education details: *** Person educated: {Person educated:25204} Education method: {Education Method:25205} Education comprehension: {Education Comprehension:25206}  HOME EXERCISE  PROGRAM: ***  ASSESSMENT:  CLINICAL IMPRESSION: Patient is a *** y.o. *** who was seen today for physical therapy evaluation and treatment for ***.   OBJECTIVE IMPAIRMENTS: {opptimpairments:25111}.   ACTIVITY LIMITATIONS: {activitylimitations:27494}  PARTICIPATION LIMITATIONS: {participationrestrictions:25113}  PERSONAL FACTORS: {Personal factors:25162} are also affecting patient's functional outcome.   REHAB POTENTIAL: {rehabpotential:25112}  CLINICAL DECISION MAKING: {clinical decision making:25114}  EVALUATION COMPLEXITY: {Evaluation complexity:25115}   GOALS: Goals reviewed with patient? {yes/no:20286}  SHORT TERM GOALS: Target date: ***  *** Baseline: Goal status: INITIAL  2.  *** Baseline:  Goal status: INITIAL  3.  *** Baseline:  Goal status: INITIAL  4.  *** Baseline:  Goal status: INITIAL  5.  *** Baseline:  Goal status: INITIAL  6.  *** Baseline:  Goal status: INITIAL  LONG TERM GOALS: Target date: ***  *** Baseline:  Goal status: INITIAL  2.  *** Baseline:  Goal status: INITIAL  3.  *** Baseline:  Goal status: INITIAL  4.  *** Baseline:  Goal status: INITIAL  5.  *** Baseline:  Goal status: INITIAL  6.  *** Baseline:  Goal status: INITIAL  PLAN:  PT FREQUENCY: {rehab frequency:25116}  PT DURATION: {rehab duration:25117}  PLANNED INTERVENTIONS: {rehab planned interventions:25118::97110-Therapeutic exercises,97530- Therapeutic [phone]- Neuromuscular re-education,97535- Self Rjmz,02859- Manual therapy,Patient/Family education}  PLAN FOR NEXT SESSION: ***   Yanuel Tagg, PT 12/09/2024, 3:16 PM

## 2024-12-10 ENCOUNTER — Ambulatory Visit: Attending: Family Medicine | Admitting: Physical Therapy

## 2024-12-10 ENCOUNTER — Telehealth: Payer: Self-pay | Admitting: Physical Therapy

## 2024-12-10 NOTE — Telephone Encounter (Signed)
 Interpreter and  front desk  tried calling both telephone numbers on file. Patient no showed for her PT evaluation

## 2025-01-20 ENCOUNTER — Ambulatory Visit: Admitting: Orthopedic Surgery
